# Patient Record
Sex: Male | Born: 1960 | Race: White | Hispanic: No | Marital: Single | Smoking: Current every day smoker
Health system: Southern US, Community
[De-identification: ages and names within clinical notes are randomized; demographics above are authoritative.]

## PROBLEM LIST (undated history)

## (undated) DIAGNOSIS — M1990 Unspecified osteoarthritis, unspecified site: Secondary | ICD-10-CM

## (undated) DIAGNOSIS — Z87442 Personal history of urinary calculi: Secondary | ICD-10-CM

## (undated) HISTORY — DX: Unspecified osteoarthritis, unspecified site: M19.90

## (undated) HISTORY — DX: Personal history of urinary calculi: Z87.442

## (undated) HISTORY — PX: LITHOTRIPSY: SUR834

---

## 2017-02-01 ENCOUNTER — Other Ambulatory Visit: Payer: Self-pay

## 2017-02-15 ENCOUNTER — Ambulatory Visit
Admission: RE | Admit: 2017-02-15 | Discharge: 2017-02-15 | Disposition: A | Discharge status: Home / Self Care | Payer: Self-pay | Source: Ambulatory Visit | Attending: Urology | Admitting: Urology

## 2017-02-15 ENCOUNTER — Ambulatory Visit (INDEPENDENT_AMBULATORY_CARE_PROVIDER_SITE_OTHER): Payer: Self-pay | Admitting: Urology

## 2017-02-15 ENCOUNTER — Encounter: Payer: Self-pay | Admitting: Urology

## 2017-02-15 VITALS — BP 134/80 | HR 66 | Ht 72.0 in | Wt 197.8 lb

## 2017-02-15 DIAGNOSIS — Z87442 Personal history of urinary calculi: Secondary | ICD-10-CM | POA: Insufficient documentation

## 2017-02-15 DIAGNOSIS — R109 Unspecified abdominal pain: Secondary | ICD-10-CM | POA: Insufficient documentation

## 2017-02-15 DIAGNOSIS — N200 Calculus of kidney: Secondary | ICD-10-CM | POA: Insufficient documentation

## 2017-02-15 DIAGNOSIS — R3129 Other microscopic hematuria: Secondary | ICD-10-CM | POA: Insufficient documentation

## 2017-02-15 LAB — URINALYSIS, COMPLETE
Bilirubin, UA: NEGATIVE
Glucose, UA: NEGATIVE
Ketones, UA: NEGATIVE
Leukocytes, UA: NEGATIVE
NITRITE UA: NEGATIVE
Protein, UA: NEGATIVE
RBC, UA: NEGATIVE
Specific Gravity, UA: 1.02 (ref 1.005–1.030)
UUROB: 0.2 mg/dL (ref 0.2–1.0)
pH, UA: 5.5 (ref 5.0–7.5)

## 2017-02-15 LAB — MICROSCOPIC EXAMINATION
Bacteria, UA: NONE SEEN
EPITHELIAL CELLS (NON RENAL): NONE SEEN /HPF (ref 0–10)
RBC MICROSCOPIC, UA: NONE SEEN /HPF (ref 0–?)

## 2017-02-15 IMAGING — CR DG ABDOMEN 1V
1 series · 2 of 2 positions shown · non-contrast
Comparison: None.

CLINICAL DATA: Left flank pain

EXAM:
ABDOMEN - 1 VIEW

[Series 1: dg abd 1 view · 0.14mm/px · 2 of 2 slices shown]
[im 1/2]
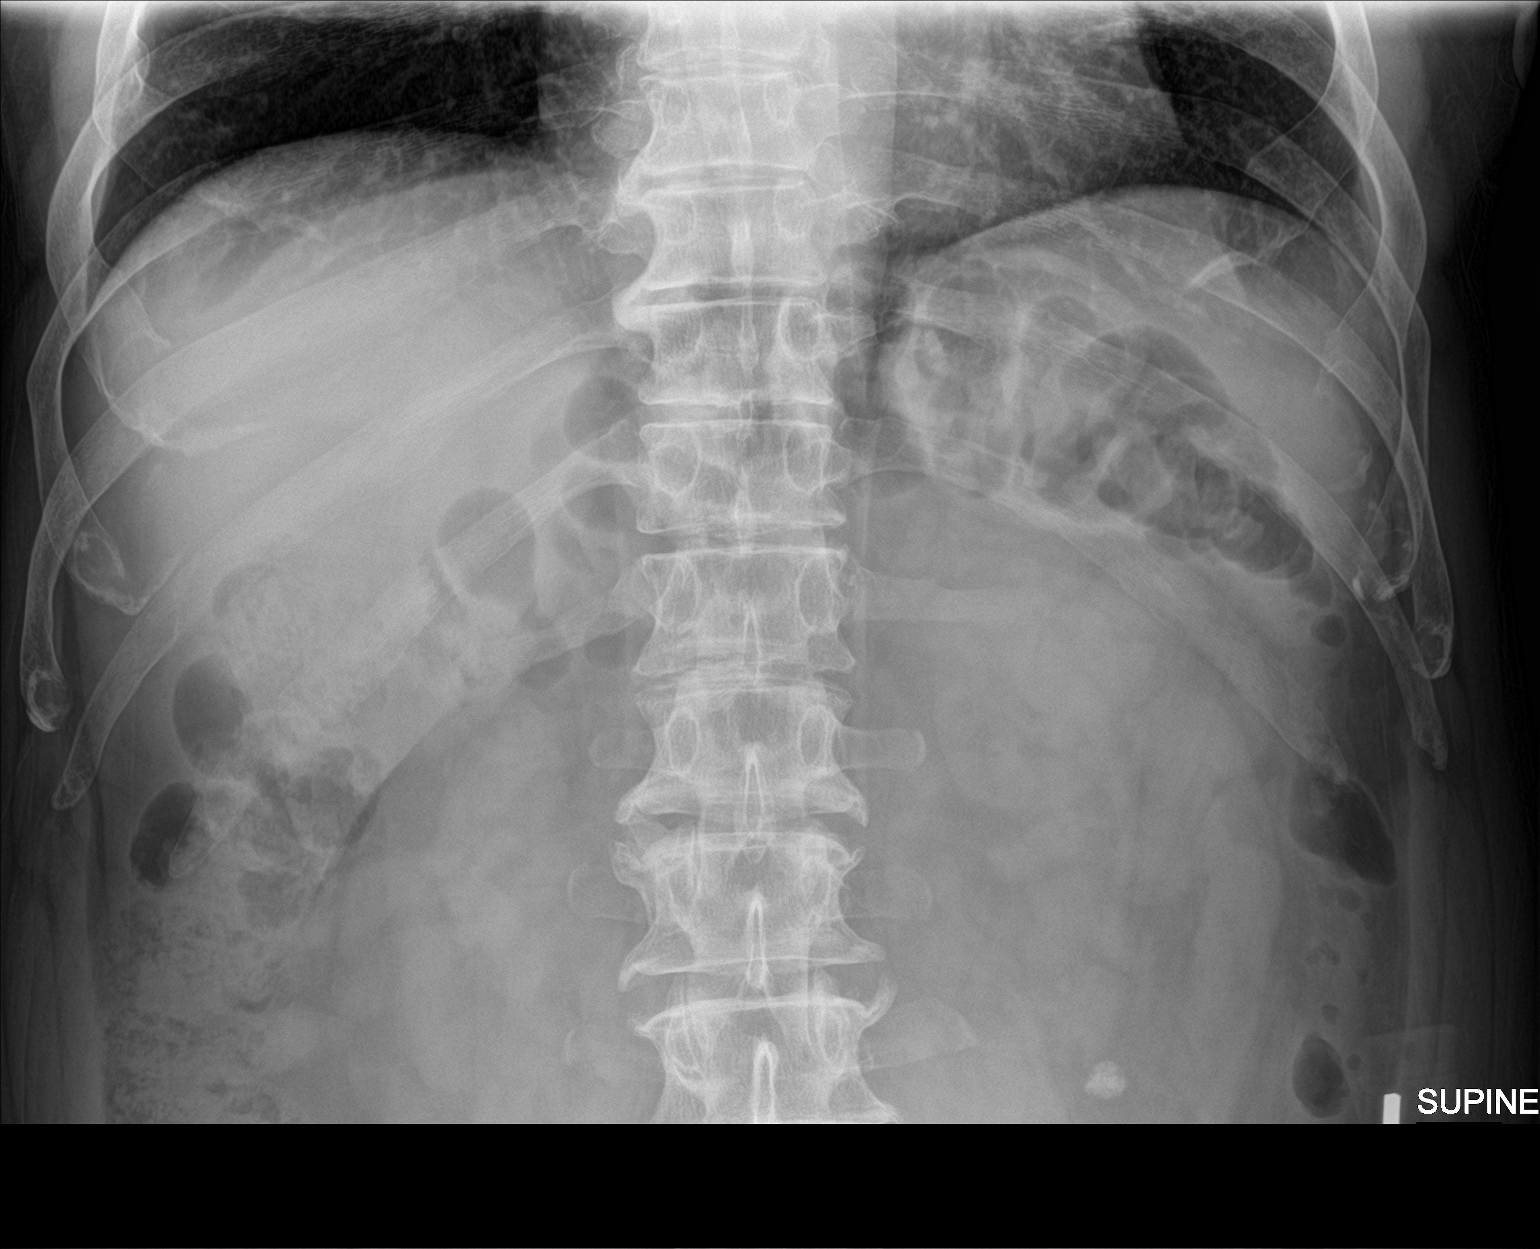
[im 2/2]
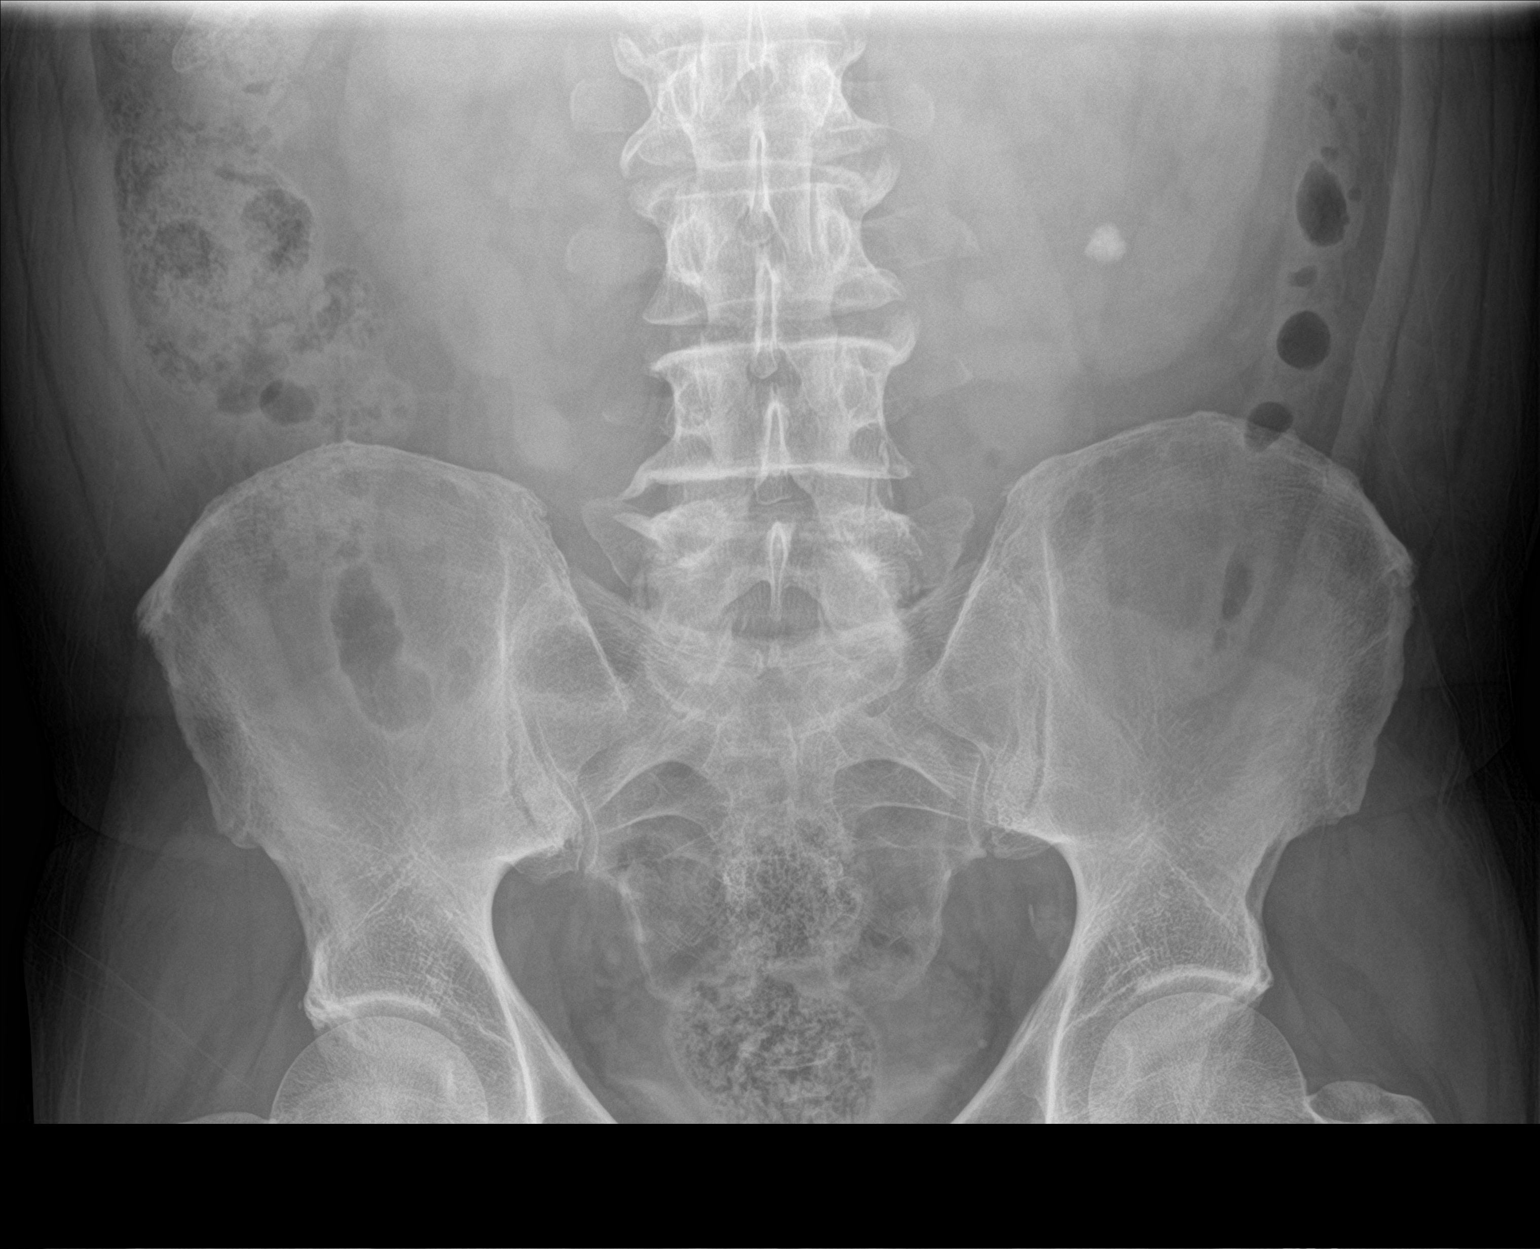

[2 of 2 positions shown; findings below may reference images not displayed]

FINDINGS: There is a calculus in the lower pole the left kidney measuring
x 1.0 cm. There are small phleboliths in the pelvis.

There is diffuse stool throughout the colon. There is no bowel
dilatation or air-fluid level to suggest bowel obstruction. No free
air. Lung bases are clear.

Note that there is paucity of gas in the mid abdomen. The colon
appears somewhat peripheral in location. These findings raise
question of a mid abdominal mass lesion.
IMPRESSION: 1.  1 x 1 cm calculus lower pole left kidney.

2. Colon is somewhat peripherally located and has an appearance that
may be draped along a central soft tissue structure. There is a
paucity of small bowel gas in the mid abdomen. These findings raise
concern for possible mid abdominal mass. Given this appearance,
consideration may wish to be given to ultrasound or CT of the
abdomen to exclude the possibility of a mid abdominal mass causing
this somewhat unusual radiographic appearance.

These results will be called to the ordering clinician or
representative by the Radiologist Assistant, and communication
documented in the PACS or zVision Dashboard.

## 2017-02-15 NOTE — Progress Notes (Signed)
02/15/2017 8:31 AM   Gregory Graves 20-Oct-1960 [id number]  Referring provider: Randa Spike, DO [phone] [facility] [address] [facility] [place], [place] [address]  Chief Complaint  Patient presents with  . Abdominal Pain    HPI: Gregory Graves is a 57 year old male seen in consultation at the request of Dr. Lorenza Chick for evaluation of flank pain and microhematuria.  In November 2018 he began to have intermittent left flank pain.  There are no identifiable precipitating, aggravating or alleviating factors.  At its worst the severity is rated 6-7/10.  He has occasional intermittent urinary stream, frequency and urinary hesitancy.  He was seen at [facility] on 01/31/2017 for this problem and urinalysis showed 4-10 RBCs.  He denies gross hematuria.  He denies fever, chills, nausea or vomiting.  Past urologic history remarkable for open stone surgery in 1993 by Dr. Leonette Monarch.   PMH: Past Medical History:  Diagnosis Date  . Arthritis   . History of kidney stones     Surgical History: Past Surgical History:  Procedure Laterality Date  . LITHOTRIPSY      Home Medications:  Allergies as of 02/15/2017   No Known Allergies     Medication List        Accurate as of 02/15/17  8:31 AM. Always use your most recent med list.          acetaminophen 325 MG tablet Commonly known as:  TYLENOL Take 650 mg by mouth every 6 (six) hours as needed.       Allergies: No Known Allergies  Family History: Family History  Problem Relation Age of Onset  . Prostate cancer Neg Hx   . Bladder Cancer Neg Hx   . Kidney cancer Neg Hx     Social History:  reports that he has been smoking.  he has never used smokeless tobacco. He reports that he does not drink alcohol or use drugs.  ROS: UROLOGY Frequent Urination?: Yes Hard to postpone urination?: No Burning/pain with urination?: No Get up at night to urinate?: No Leakage of urine?: No Urine stream starts and stops?:  Yes Trouble starting stream?: Yes Do you have to strain to urinate?: Yes Blood in urine?: Yes Urinary tract infection?: No Sexually transmitted disease?: No Injury to kidneys or bladder?: No Painful intercourse?: No Weak stream?: Yes Erection problems?: No Penile pain?: No  Gastrointestinal Nausea?: No Vomiting?: No Indigestion/heartburn?: No Diarrhea?: No Constipation?: No  Constitutional Fever: No Night sweats?: No Weight loss?: No Fatigue?: No  Skin Skin rash/lesions?: No Itching?: No  Eyes Blurred vision?: No Double vision?: No  Ears/Nose/Throat Sore throat?: No Sinus problems?: No  Hematologic/Lymphatic Swollen glands?: No Easy bruising?: No  Cardiovascular Leg swelling?: No Chest pain?: No  Respiratory Cough?: No Shortness of breath?: No  Endocrine Excessive thirst?: No  Musculoskeletal Back pain?: Yes Joint pain?: No  Neurological Headaches?: No Dizziness?: No  Psychologic Depression?: No Anxiety?: No  Physical Exam: BP 134/80 (BP Location: Right Arm, Patient Position: Sitting, Cuff Size: Normal)   Pulse 66   Ht 6' (1.829 m)   Wt 197 lb 12.8 oz (89.7 kg)   BMI 26.83 kg/m   Constitutional:  Alert and oriented, No acute distress. HEENT: [place] AT, moist mucus membranes.  Trachea midline, no masses. Cardiovascular: No clubbing, cyanosis, or edema.  CV RRR Respiratory: Normal respiratory effort, no increased work of breathing.  Lungs clear GI: Abdomen is soft, nontender, nondistended, no abdominal masses GU: No CVA tenderness.  Skin: No rashes,  bruises or suspicious lesions. Lymph: No cervical or inguinal adenopathy. Neurologic: Grossly intact, no focal deficits, moving all 4 extremities. Psychiatric: Normal mood and affect.  Laboratory Data:  Urinalysis Dipstick/microscopy negative for significant microhematuria   Assessment & Plan:  57 year old male with intermittent left flank pain, history of stones and microhematuria.   A  KUB was ordered and shows a 10 mm left lower pole calculus.  There is also a groundglass appearing mass in the abdomen suggesting a possible abdominal mass.  Recommend proceeding with a CT urogram for further evaluation.  1. Flank pain  2. Microhematuria  3. Personal history of kidney stones    Riki AltesScott C Madellyn Denio, MD  [facility] [address], [address] [place], [place] [address] [phone]

## 2017-02-23 ENCOUNTER — Ambulatory Visit: Payer: Self-pay | Admitting: Urology

## 2017-03-07 ENCOUNTER — Other Ambulatory Visit: Payer: Self-pay | Admitting: Urology

## 2017-03-07 DIAGNOSIS — R3129 Other microscopic hematuria: Secondary | ICD-10-CM

## 2017-03-21 ENCOUNTER — Telehealth: Payer: Self-pay | Admitting: Urology

## 2017-03-21 NOTE — Telephone Encounter (Signed)
He never got called for his CT scan you ordered and there was no expected date. I told scheduling to go ahead and call him to schedule since this was supposed to have been done in February. Do you want a follow  Up or just call him?  Gregory DusterMichelle

## 2017-03-21 NOTE — Telephone Encounter (Signed)
Go ahead and make a follow-up to discuss results.

## 2017-03-30 ENCOUNTER — Ambulatory Visit
Admission: RE | Admit: 2017-03-30 | Discharge: 2017-03-30 | Disposition: A | Discharge status: Home / Self Care | Payer: Self-pay | Source: Ambulatory Visit | Attending: Urology | Admitting: Urology

## 2017-03-30 DIAGNOSIS — N200 Calculus of kidney: Secondary | ICD-10-CM | POA: Insufficient documentation

## 2017-03-30 DIAGNOSIS — N1330 Unspecified hydronephrosis: Secondary | ICD-10-CM | POA: Insufficient documentation

## 2017-03-30 DIAGNOSIS — N400 Enlarged prostate without lower urinary tract symptoms: Secondary | ICD-10-CM | POA: Insufficient documentation

## 2017-03-30 DIAGNOSIS — I700 Atherosclerosis of aorta: Secondary | ICD-10-CM | POA: Insufficient documentation

## 2017-03-30 DIAGNOSIS — R3129 Other microscopic hematuria: Secondary | ICD-10-CM | POA: Insufficient documentation

## 2017-03-30 MED ORDER — IOPAMIDOL (ISOVUE-300) INJECTION 61%
125.0000 mL | Freq: Once | INTRAVENOUS | Status: AC | PRN
Start: 2017-03-30 — End: 2017-03-30
  Administered 2017-03-30: 125 mL via INTRAVENOUS

## 2017-04-05 ENCOUNTER — Encounter: Payer: Self-pay | Admitting: Urology

## 2017-04-05 ENCOUNTER — Other Ambulatory Visit: Payer: Self-pay | Admitting: Radiology

## 2017-04-05 ENCOUNTER — Ambulatory Visit (INDEPENDENT_AMBULATORY_CARE_PROVIDER_SITE_OTHER): Payer: Self-pay | Admitting: Urology

## 2017-04-05 VITALS — BP 125/73 | HR 67 | Resp 16 | Ht 71.0 in | Wt 193.3 lb

## 2017-04-05 DIAGNOSIS — N1330 Unspecified hydronephrosis: Secondary | ICD-10-CM

## 2017-04-05 DIAGNOSIS — N200 Calculus of kidney: Secondary | ICD-10-CM

## 2017-04-05 NOTE — H&P (View-Only) (Signed)
04/05/2017 10:29 AM   Gregory Graves [birth year] [id number]  Referring provider: No referring provider defined for this encounter.    Chief complaint: Follow-up  HPI: 57 year old male seen February 2019 for left flank pain and microhematuria.  He had a history of stone disease and a KUB showed a 10 mm left lower pole calculus and suggestion of a mid abdominal mass.  A CT scan was subsequently ordered and he presents today for discussion of the results.  He continues to have mild, intermittent left flank pain.  He has a history of open stone surgery in the early 1990s.  His CT shows a moderate to severe left hydronephrosis and a nonobstructing left lower pole calculus.  No ureteral dilation is noted.  PMH: Past Medical History:  Diagnosis Date  . Arthritis   . History of kidney stones     Surgical History: Past Surgical History:  Procedure Laterality Date  . LITHOTRIPSY      Home Medications:  Allergies as of 04/05/2017   No Known Allergies     Medication List        Accurate as of 04/05/17 10:29 AM. Always use your most recent med list.          acetaminophen 325 MG tablet Commonly known as:  TYLENOL Take 650 mg by mouth every 6 (six) hours as needed.       Allergies: No Known Allergies  Family History: Family History  Problem Relation Age of Onset  . Prostate cancer Neg Hx   . Bladder Cancer Neg Hx   . Kidney cancer Neg Hx     Social History:  reports that he has been smoking.  He has never used smokeless tobacco. He reports that he does not drink alcohol or use drugs.  ROS: UROLOGY Frequent Urination?: No Hard to postpone urination?: No Burning/pain with urination?: No Get up at night to urinate?: Yes Leakage of urine?: No Urine stream starts and stops?: No Trouble starting stream?: No Do you have to strain to urinate?: No Blood in urine?: No Urinary tract infection?: No Sexually transmitted disease?: No Injury to kidneys or bladder?:  No Painful intercourse?: No Weak stream?: Yes Erection problems?: No Penile pain?: No  Gastrointestinal Nausea?: No Vomiting?: No Indigestion/heartburn?: No Diarrhea?: No Constipation?: No  Constitutional Fever: No Night sweats?: No Weight loss?: No Fatigue?: No  Skin Skin rash/lesions?: No Itching?: No  Eyes Blurred vision?: No Double vision?: No  Ears/Nose/Throat Sore throat?: No Sinus problems?: No  Hematologic/Lymphatic Swollen glands?: No Easy bruising?: No  Cardiovascular Leg swelling?: No Chest pain?: No  Respiratory Cough?: No Shortness of breath?: No  Endocrine Excessive thirst?: No  Musculoskeletal Back pain?: Yes Joint pain?: No  Neurological Headaches?: No Dizziness?: No  Psychologic Depression?: No Anxiety?: No  Physical Exam: BP 125/73   Pulse 67   Resp 16   Ht 5\' 11"  (1.803 m)   Wt 193 lb 4.8 oz (87.7 kg)   SpO2 98%   BMI 26.96 kg/m   Constitutional:  Alert and oriented, No acute distress. HEENT: [place] AT, moist mucus membranes.  Trachea midline, no masses. Cardiovascular: No clubbing, cyanosis, or edema.  RRR Respiratory: Normal respiratory effort, no increased work of breathing.  Lungs clear GI: Abdomen is soft, nontender, nondistended, no abdominal masses GU: No CVA tenderness Lymph: No cervical or inguinal lymphadenopathy. Skin: No rashes, bruises or suspicious lesions. Neurologic: Grossly intact, no focal deficits, moving all 4 extremities. Psychiatric: Normal mood and affect.   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  No prior imaging is available for comparison.  We discussed the possibility of ureteral stricture.  I recommended scheduling cystoscopy with left retrograde pyelogram and left ureteroscopy.  If no significant stricture we discussed laser lithotripsy and removal of  his left renal calculus.  If significant stricture is noted will attempt stent placement.  The potential need for percutaneous nephrostomy was also discussed.  We discussed potential complications of bleeding, infection and ureteral injury as well as the risks of general anesthesia.  He indicated all questions were answered and desires to proceed.   Riki AltesScott C Letricia Krinsky, MD  [facility] [address], [address] [place], [place] [address] [phone]

## 2017-04-05 NOTE — Progress Notes (Signed)
04/05/2017 10:29 AM   Gregory Graves [birth year] [id number]  Referring provider: No referring provider defined for this encounter.    Chief complaint: Follow-up  HPI: 57 year old male seen February 2019 for left flank pain and microhematuria.  He had a history of stone disease and a KUB showed a 10 mm left lower pole calculus and suggestion of a mid abdominal mass.  A CT scan was subsequently ordered and he presents today for discussion of the results.  He continues to have mild, intermittent left flank pain.  He has a history of open stone surgery in the early 1990s.  His CT shows a moderate to severe left hydronephrosis and a nonobstructing left lower pole calculus.  No ureteral dilation is noted.  PMH: Past Medical History:  Diagnosis Date  . Arthritis   . History of kidney stones     Surgical History: Past Surgical History:  Procedure Laterality Date  . LITHOTRIPSY      Home Medications:  Allergies as of 04/05/2017   No Known Allergies     Medication List        Accurate as of 04/05/17 10:29 AM. Always use your most recent med list.          acetaminophen 325 MG tablet Commonly known as:  TYLENOL Take 650 mg by mouth every 6 (six) hours as needed.       Allergies: No Known Allergies  Family History: Family History  Problem Relation Age of Onset  . Prostate cancer Neg Hx   . Bladder Cancer Neg Hx   . Kidney cancer Neg Hx     Social History:  reports that he has been smoking.  He has never used smokeless tobacco. He reports that he does not drink alcohol or use drugs.  ROS: UROLOGY Frequent Urination?: No Hard to postpone urination?: No Burning/pain with urination?: No Get up at night to urinate?: Yes Leakage of urine?: No Urine stream starts and stops?: No Trouble starting stream?: No Do you have to strain to urinate?: No Blood in urine?: No Urinary tract infection?: No Sexually transmitted disease?: No Injury to kidneys or bladder?:  No Painful intercourse?: No Weak stream?: Yes Erection problems?: No Penile pain?: No  Gastrointestinal Nausea?: No Vomiting?: No Indigestion/heartburn?: No Diarrhea?: No Constipation?: No  Constitutional Fever: No Night sweats?: No Weight loss?: No Fatigue?: No  Skin Skin rash/lesions?: No Itching?: No  Eyes Blurred vision?: No Double vision?: No  Ears/Nose/Throat Sore throat?: No Sinus problems?: No  Hematologic/Lymphatic Swollen glands?: No Easy bruising?: No  Cardiovascular Leg swelling?: No Chest pain?: No  Respiratory Cough?: No Shortness of breath?: No  Endocrine Excessive thirst?: No  Musculoskeletal Back pain?: Yes Joint pain?: No  Neurological Headaches?: No Dizziness?: No  Psychologic Depression?: No Anxiety?: No  Physical Exam: BP 125/73   Pulse 67   Resp 16   Ht 5\' 11"  (1.803 m)   Wt 193 lb 4.8 oz (87.7 kg)   SpO2 98%   BMI 26.96 kg/m   Constitutional:  Alert and oriented, No acute distress. HEENT: [place] AT, moist mucus membranes.  Trachea midline, no masses. Cardiovascular: No clubbing, cyanosis, or edema.  RRR Respiratory: Normal respiratory effort, no increased work of breathing.  Lungs clear GI: Abdomen is soft, nontender, nondistended, no abdominal masses GU: No CVA tenderness Lymph: No cervical or inguinal lymphadenopathy. Skin: No rashes, bruises or suspicious lesions. Neurologic: Grossly intact, no focal deficits, moving all 4 extremities. Psychiatric: Normal mood and affect.   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  No prior imaging is available for comparison.  We discussed the possibility of ureteral stricture.  I recommended scheduling cystoscopy with left retrograde pyelogram and left ureteroscopy.  If no significant stricture we discussed laser lithotripsy and removal of  his left renal calculus.  If significant stricture is noted will attempt stent placement.  The potential need for percutaneous nephrostomy was also discussed.  We discussed potential complications of bleeding, infection and ureteral injury as well as the risks of general anesthesia.  He indicated all questions were answered and desires to proceed.   Riki AltesScott C Stoioff, MD  [facility] [address], [address] [place], [place] [address] [phone]

## 2017-04-06 ENCOUNTER — Other Ambulatory Visit: Payer: Self-pay | Admitting: Radiology

## 2017-04-06 ENCOUNTER — Other Ambulatory Visit: Payer: Self-pay

## 2017-04-06 DIAGNOSIS — N200 Calculus of kidney: Secondary | ICD-10-CM

## 2017-04-06 DIAGNOSIS — N1330 Unspecified hydronephrosis: Secondary | ICD-10-CM

## 2017-04-06 NOTE — Addendum Note (Signed)
Addended by: Honor LohGARRISON, Anayeli Arel M on: 04/06/2017 01:42 PM   Modules accepted: Orders

## 2017-04-07 ENCOUNTER — Other Ambulatory Visit: Payer: Self-pay | Admitting: Radiology

## 2017-04-07 LAB — URINALYSIS, COMPLETE
BILIRUBIN UA: NEGATIVE
Glucose, UA: NEGATIVE
KETONES UA: NEGATIVE
LEUKOCYTES UA: NEGATIVE
Nitrite, UA: NEGATIVE
PROTEIN UA: NEGATIVE
SPEC GRAV UA: 1.02 (ref 1.005–1.030)
UUROB: 0.2 mg/dL (ref 0.2–1.0)
pH, UA: 6.5 (ref 5.0–7.5)

## 2017-04-07 LAB — MICROSCOPIC EXAMINATION
Bacteria, UA: NONE SEEN
Epithelial Cells (non renal): NONE SEEN /hpf (ref 0–10)

## 2017-04-11 ENCOUNTER — Other Ambulatory Visit: Payer: Self-pay | Admitting: Radiology

## 2017-04-11 ENCOUNTER — Other Ambulatory Visit: Payer: Self-pay

## 2017-04-11 DIAGNOSIS — N200 Calculus of kidney: Secondary | ICD-10-CM

## 2017-04-14 ENCOUNTER — Encounter
Admission: RE | Admit: 2017-04-14 | Discharge: 2017-04-14 | Disposition: A | Discharge status: Home / Self Care | Payer: Self-pay | Source: Ambulatory Visit | Attending: Urology | Admitting: Urology

## 2017-04-14 ENCOUNTER — Other Ambulatory Visit: Payer: Self-pay

## 2017-04-14 LAB — CULTURE, URINE COMPREHENSIVE

## 2017-04-14 NOTE — Patient Instructions (Signed)
Your procedure is scheduled on: 04-18-17 Report to Same Day Surgery 2nd floor medical mall [facility](Medical Mall Entrance-take elevator on left to 2nd floor.  Check in with surgery information desk.) To find out your arrival time please call [phone] between 1PM - 3PM on 04-17-17  Remember: Instructions that are not followed completely may result in serious medical risk, up to and including death, or upon the discretion of your surgeon and anesthesiologist your surgery may need to be rescheduled.    _x___ 1. Do not eat food after midnight the night before your procedure. NO GUM OR CANDY AFTER MIDNIGHT.  You may drink clear liquids up to 2 hours before you are scheduled to arrive at the hospital for your procedure.  Do not drink clear liquids within 2 hours of your scheduled arrival to the hospital.  Clear liquids include  --Water or Apple juice without pulp  --Clear carbohydrate beverage such as ClearFast or Gatorade  --Black Coffee or Clear Tea (No milk, no creamers, do not add anything to the coffee or Tea     __x__ 2. No Alcohol for 24 hours before or after surgery.   __x__3. No Smoking or e-cigarettes for 24 prior to surgery.  Do not use any chewable tobacco products for at least 6 hour prior to surgery   ____  4. Bring all medications with you on the day of surgery if instructed.    __x__ 5. Notify your doctor if there is any change in your medical condition     (cold, fever, infections).    x___6. On the morning of surgery brush your teeth with toothpaste and water.  You may rinse your mouth with mouth wash if you wish.  Do not swallow any toothpaste or mouthwash.   Do not wear jewelry, make-up, hairpins, clips or nail polish.  Do not wear lotions, powders, or perfumes. You may wear deodorant.  Do not shave 48 hours prior to surgery. Men may shave face and neck.  Do not bring valuables to the hospital.    [facility] is not responsible for any belongings or valuables.    Contacts, dentures or bridgework may not be worn into surgery.  Leave your suitcase in the car. After surgery it may be brought to your room.  For patients admitted to the hospital, discharge time is determined by your treatment team.  _  Patients discharged the day of surgery will not be allowed to drive home.  You will need someone to drive you home and stay with you the night of your procedure.   ____ Take anti-hypertensive listed below, cardiac, seizure, asthma, anti-reflux and psychiatric medicines. These include:  1. NONE  2.  3.  4.  5.  6.  ____Fleets enema or Magnesium Citrate as directed.   ____ Use CHG Soap or sage wipes as directed on instruction sheet   ____ Use inhalers on the day of surgery and bring to hospital day of surgery  ____ Stop Metformin and Janumet 2 days prior to surgery.    ____ Take 1/2 of usual insulin dose the night before surgery and none on the morning surgery.   ____ Follow recommendations from Cardiologist, Pulmonologist or PCP regarding stopping Aspirin, Coumadin, Plavix ,Eliquis, Effient, or Pradaxa, and Pletal.  X____Stop Anti-inflammatories such as Advil, Aleve, Ibuprofen, Motrin, Naproxen, Naprosyn, Goodies powders or aspirin products NOW-OK to take Tylenol    ____ Stop supplements until after surgery.     ____ Bring C-Pap to the hospital.

## 2017-04-18 ENCOUNTER — Ambulatory Visit: Payer: Self-pay | Admitting: Anesthesiology

## 2017-04-18 ENCOUNTER — Encounter: Payer: Self-pay | Admitting: Anesthesiology

## 2017-04-18 ENCOUNTER — Ambulatory Visit
Admission: RE | Admit: 2017-04-18 | Discharge: 2017-04-18 | Disposition: A | Discharge status: Home / Self Care | Payer: Self-pay | Source: Ambulatory Visit | Attending: Urology | Admitting: Urology

## 2017-04-18 ENCOUNTER — Encounter
Admission: RE | Disposition: A | Discharge status: Home / Self Care | Payer: Self-pay | Source: Ambulatory Visit | Attending: Urology

## 2017-04-18 DIAGNOSIS — M1990 Unspecified osteoarthritis, unspecified site: Secondary | ICD-10-CM | POA: Insufficient documentation

## 2017-04-18 DIAGNOSIS — N132 Hydronephrosis with renal and ureteral calculous obstruction: Secondary | ICD-10-CM | POA: Insufficient documentation

## 2017-04-18 DIAGNOSIS — N200 Calculus of kidney: Secondary | ICD-10-CM

## 2017-04-18 DIAGNOSIS — N1330 Unspecified hydronephrosis: Secondary | ICD-10-CM

## 2017-04-18 DIAGNOSIS — Z87442 Personal history of urinary calculi: Secondary | ICD-10-CM | POA: Insufficient documentation

## 2017-04-18 HISTORY — PX: CYSTOSCOPY WITH URETEROSCOPY: SHX5123

## 2017-04-18 HISTORY — PX: CYSTOSCOPY WITH STENT PLACEMENT: SHX5790

## 2017-04-18 HISTORY — PX: CYSTOSCOPY W/ RETROGRADES: SHX1426

## 2017-04-18 SURGERY — CYSTOSCOPY, WITH RETROGRADE PYELOGRAM
Anesthesia: General | Site: Ureter | Laterality: Left | Wound class: Clean Contaminated

## 2017-04-18 MED ORDER — ONDANSETRON HCL 4 MG/2ML IJ SOLN
INTRAMUSCULAR | Status: DC | PRN
  Administered 2017-04-18: 4 mg via INTRAVENOUS

## 2017-04-18 MED ORDER — PROPOFOL 10 MG/ML IV BOLUS
INTRAVENOUS | Status: DC | PRN
  Administered 2017-04-18: 150 mg via INTRAVENOUS

## 2017-04-18 MED ORDER — DEXAMETHASONE SODIUM PHOSPHATE 10 MG/ML IJ SOLN
INTRAMUSCULAR | Status: AC
  Filled 2017-04-18: qty 1

## 2017-04-18 MED ORDER — MIDAZOLAM HCL 2 MG/2ML IJ SOLN
INTRAMUSCULAR | Status: AC
  Filled 2017-04-18: qty 2

## 2017-04-18 MED ORDER — PROPOFOL 10 MG/ML IV BOLUS
INTRAVENOUS | Status: AC
  Filled 2017-04-18: qty 40

## 2017-04-18 MED ORDER — FENTANYL CITRATE (PF) 100 MCG/2ML IJ SOLN: 25.0000 ug | INTRAMUSCULAR | Status: DC | PRN

## 2017-04-18 MED ORDER — GLYCOPYRROLATE 0.2 MG/ML IJ SOLN
INTRAMUSCULAR | Status: AC
  Filled 2017-04-18: qty 1

## 2017-04-18 MED ORDER — OXYBUTYNIN CHLORIDE 5 MG PO TABS: ORAL_TABLET | ORAL | 0 refills | Status: DC

## 2017-04-18 MED ORDER — DEXAMETHASONE SODIUM PHOSPHATE 10 MG/ML IJ SOLN
INTRAMUSCULAR | Status: DC | PRN
  Administered 2017-04-18: 10 mg via INTRAVENOUS

## 2017-04-18 MED ORDER — LIDOCAINE HCL (CARDIAC) 20 MG/ML IV SOLN
INTRAVENOUS | Status: DC | PRN
  Administered 2017-04-18: 100 mg via INTRAVENOUS

## 2017-04-18 MED ORDER — HYDROCODONE-ACETAMINOPHEN 5-325 MG PO TABS: 1.0000 | ORAL_TABLET | ORAL | 0 refills | Status: DC | PRN

## 2017-04-18 MED ORDER — FAMOTIDINE 20 MG PO TABS
ORAL_TABLET | ORAL | Status: AC
  Administered 2017-04-18: 20 mg via ORAL
  Filled 2017-04-18: qty 1

## 2017-04-18 MED ORDER — FAMOTIDINE 20 MG PO TABS
20.0000 mg | ORAL_TABLET | Freq: Once | ORAL | Status: AC
  Administered 2017-04-18: 20 mg via ORAL

## 2017-04-18 MED ORDER — MIDAZOLAM HCL 2 MG/2ML IJ SOLN
INTRAMUSCULAR | Status: DC | PRN
  Administered 2017-04-18: 2 mg via INTRAVENOUS

## 2017-04-18 MED ORDER — LACTATED RINGERS IV SOLN
INTRAVENOUS | Status: DC
  Administered 2017-04-18: 11:00:00 via INTRAVENOUS

## 2017-04-18 MED ORDER — FENTANYL CITRATE (PF) 100 MCG/2ML IJ SOLN
INTRAMUSCULAR | Status: DC | PRN
  Administered 2017-04-18 (×2): 50 ug via INTRAVENOUS

## 2017-04-18 MED ORDER — CEFAZOLIN SODIUM-DEXTROSE 2-3 GM-%(50ML) IV SOLR
INTRAVENOUS | Status: AC
  Filled 2017-04-18: qty 50

## 2017-04-18 MED ORDER — LIDOCAINE HCL (PF) 2 % IJ SOLN
INTRAMUSCULAR | Status: AC
  Filled 2017-04-18: qty 10

## 2017-04-18 MED ORDER — IOTHALAMATE MEGLUMINE 43 % IV SOLN
INTRAVENOUS | Status: DC | PRN
  Administered 2017-04-18: 15 mL via URETHRAL

## 2017-04-18 MED ORDER — ONDANSETRON HCL 4 MG/2ML IJ SOLN
INTRAMUSCULAR | Status: AC
  Filled 2017-04-18: qty 2

## 2017-04-18 MED ORDER — ONDANSETRON HCL 4 MG/2ML IJ SOLN: 4.0000 mg | Freq: Once | INTRAMUSCULAR | Status: DC | PRN

## 2017-04-18 MED ORDER — GLYCOPYRROLATE 0.2 MG/ML IJ SOLN
INTRAMUSCULAR | Status: DC | PRN
  Administered 2017-04-18: 0.2 mg via INTRAVENOUS

## 2017-04-18 MED ORDER — CEFAZOLIN SODIUM-DEXTROSE 2-4 GM/100ML-% IV SOLN
2.0000 g | INTRAVENOUS | Status: AC
  Administered 2017-04-18: 2 g via INTRAVENOUS
  Filled 2017-04-18: qty 100

## 2017-04-18 MED ORDER — FENTANYL CITRATE (PF) 100 MCG/2ML IJ SOLN
INTRAMUSCULAR | Status: AC
Start: 2017-04-18 — End: 2017-04-18
  Filled 2017-04-18: qty 2

## 2017-04-18 SURGICAL SUPPLY — 30 items
BAG DRAIN CYSTO-URO LG1000N (MISCELLANEOUS) ×4 IMPLANT
BASKET ZERO TIP 1.9FR (BASKET) IMPLANT
BRUSH SCRUB EZ  4% CHG (MISCELLANEOUS) ×2
BRUSH SCRUB EZ 1% IODOPHOR (MISCELLANEOUS) ×4 IMPLANT
BRUSH SCRUB EZ 4% CHG (MISCELLANEOUS) ×2 IMPLANT
CATH URETL 5X70 OPEN END (CATHETERS) ×4 IMPLANT
CNTNR SPEC 2.5X3XGRAD LEK (MISCELLANEOUS)
CONRAY 43 FOR UROLOGY 50M (MISCELLANEOUS) ×4 IMPLANT
CONT SPEC 4OZ STER OR WHT (MISCELLANEOUS)
CONTAINER SPEC 2.5X3XGRAD LEK (MISCELLANEOUS) IMPLANT
DRAPE UTILITY 15X26 TOWEL STRL (DRAPES) ×4 IMPLANT
FIBER LASER LITHO 273 (Laser) IMPLANT
GLOVE BIO SURGEON STRL SZ8 (GLOVE) ×4 IMPLANT
GOWN STRL REUS W/ TWL LRG LVL3 (GOWN DISPOSABLE) ×4 IMPLANT
GOWN STRL REUS W/TWL LRG LVL3 (GOWN DISPOSABLE) ×4
GUIDEWIRE GREEN .038 145CM (MISCELLANEOUS) IMPLANT
INFUSOR MANOMETER BAG 3000ML (MISCELLANEOUS) ×4 IMPLANT
INTRODUCER DILATOR DOUBLE (INTRODUCER) IMPLANT
KIT TURNOVER CYSTO (KITS) ×4 IMPLANT
PACK CYSTO AR (MISCELLANEOUS) ×4 IMPLANT
SENSORWIRE 0.038 NOT ANGLED (WIRE) ×4
SET CYSTO W/LG BORE CLAMP LF (SET/KITS/TRAYS/PACK) ×4 IMPLANT
SHEATH URETERAL 12FRX35CM (MISCELLANEOUS) IMPLANT
SOL .9 NS 3000ML IRR  AL (IV SOLUTION) ×2
SOL .9 NS 3000ML IRR UROMATIC (IV SOLUTION) ×2 IMPLANT
STENT URET 6FRX24 CONTOUR (STENTS) IMPLANT
STENT URET 6FRX26 CONTOUR (STENTS) ×4 IMPLANT
SURGILUBE 2OZ TUBE FLIPTOP (MISCELLANEOUS) ×4 IMPLANT
WATER STERILE IRR 1000ML POUR (IV SOLUTION) ×4 IMPLANT
WIRE SENSOR 0.038 NOT ANGLED (WIRE) ×2 IMPLANT

## 2017-04-18 NOTE — Transfer of Care (Signed)
Immediate Anesthesia Transfer of Care Note  Patient: Areatha KeasRandy Tarquinio  Procedure(s) Performed: Procedure(s): CYSTOSCOPY WITH RETROGRADE PYELOGRAM (Left) CYSTOSCOPY WITH URETEROSCOPY (Left) CYSTOSCOPY WITH STENT PLACEMENT (Left)  Patient Location: PACU  Anesthesia Type:General  Level of Consciousness: sedated  Airway & Oxygen Therapy: Patient Spontanous Breathing and Patient connected to face mask oxygen  Post-op Assessment: Report given to RN and Post -op Vital signs reviewed and stable  Post vital signs: Reviewed and stable  Last Vitals:  Vitals:   04/18/17 1008 04/18/17 1210  BP: 116/70 123/76  Pulse: (!) 55 81  Resp: 16 18  Temp: (!) 36.1 C 36.5 C  SpO2: 100% 100%    Complications: No apparent anesthesia complications

## 2017-04-18 NOTE — OR Nursing (Signed)
Disregard previous note stating to pacu. Note entered in error.

## 2017-04-18 NOTE — Discharge Instructions (Signed)

## 2017-04-18 NOTE — Anesthesia Procedure Notes (Signed)
Procedure Name: LMA Insertion Date/Time: 04/18/2017 11:32 AM Performed by: Stormy Fabianurtis, Dorthy Magnussen, CRNA Pre-anesthesia Checklist: Patient identified, Patient being monitored, Timeout performed, Emergency Drugs available and Suction available Patient Re-evaluated:Patient Re-evaluated prior to induction Oxygen Delivery Method: Circle system utilized Preoxygenation: Pre-oxygenation with 100% oxygen Induction Type: IV induction Ventilation: Mask ventilation without difficulty LMA: LMA inserted LMA Size: 3.5 Tube type: Oral Number of attempts: 1 Placement Confirmation: positive ETCO2 and breath sounds checked- equal and bilateral Tube secured with: Tape Dental Injury: Teeth and Oropharynx as per pre-operative assessment

## 2017-04-18 NOTE — Anesthesia Postprocedure Evaluation (Signed)
Anesthesia Post Note  Patient: Gregory Graves  Procedure(s) Performed: CYSTOSCOPY WITH RETROGRADE PYELOGRAM (Left Ureter) CYSTOSCOPY WITH URETEROSCOPY (Left Ureter) CYSTOSCOPY WITH STENT PLACEMENT (Left Ureter)  Patient location during evaluation: PACU Anesthesia Type: General Level of consciousness: awake and alert Pain management: pain level controlled Vital Signs Assessment: post-procedure vital signs reviewed and stable Respiratory status: spontaneous breathing, nonlabored ventilation, respiratory function stable and patient connected to nasal cannula oxygen Cardiovascular status: blood pressure returned to baseline and stable Postop Assessment: no apparent nausea or vomiting Anesthetic complications: no     Last Vitals:  Vitals:   04/18/17 1240 04/18/17 1249  BP: 123/75 123/69  Pulse: 63 (!) 48  Resp: 14 14  Temp: (!) 36.3 C 36.5 C  SpO2: 99% 100%    Last Pain:  Vitals:   04/18/17 1249  TempSrc: Oral  PainSc: 0-No pain                 Nellie Pester S

## 2017-04-18 NOTE — Anesthesia Post-op Follow-up Note (Signed)
Anesthesia QCDR form completed.        

## 2017-04-18 NOTE — Interval H&P Note (Signed)
History and Physical Interval Note:  04/18/2017 10:59 AM  Gregory Graves  has presented today for surgery, with the diagnosis of left hydronephrosis, left nephrolithiasis  The various methods of treatment have been discussed with the patient and family. After consideration of risks, benefits and other options for treatment, the patient has consented to  Procedure(s): CYSTOSCOPY/URETEROSCOPY/HOLMIUM LASER/STENT PLACEMENT (Left) CYSTOSCOPY WITH RETROGRADE PYELOGRAM (Left) as a surgical intervention .  The patient's history has been reviewed, patient examined, no change in status, stable for surgery.  I have reviewed the patient's chart and labs.  Questions were answered to the patient's satisfaction.     Scott C Stoioff

## 2017-04-18 NOTE — Op Note (Signed)
Preoperative diagnosis:  1. Left hydronephrosis 2. Left nephrolithiasis  Postoperative diagnosis:  1. Left hydronephrosis 2. Left nephrolithiasis  Procedure:  1. Cystoscopy 2. Left ureteroscopy 3. Left ureteral stent placement (6FR) 26 cm 4. Left retrograde pyelography with interpretation   Surgeon: Lorin PicketScott C. Adelina Collard, M.D.  Anesthesia: General  Complications: None  Intraoperative findings:  1. Retrograde pyelogram- moderate to severe left hydronephrosis.;  Nonobstructing left calyceal calculus.  Narrowing of the left proximal ureter for approximately 2 cm  EBL: Minimal  Specimens: None  Indication: Gregory KeasRandy Graves is a 57 y.o. patient with intermittent left flank pain.  He has a history of stone disease and open surgery for a left ureteral calculus in the early 1990s.  A CT urogram showed severe left hydronephrosis at the UPJ with apparent obstruction.  He presents for cystoscopy with left retrograde pyelogram, possible left ureteral stent placement and ureteroscopy.. After reviewing the management options for treatment, he elected to proceed with the above surgical procedure(s). We have discussed the potential benefits and risks of the procedure, side effects of the proposed treatment, the likelihood of the patient achieving the goals of the procedure, and any potential problems that might occur during the procedure or recuperation. Informed consent has been obtained.  Description of procedure:  The patient was taken to the operating room and general anesthesia was induced.  The patient was placed in the dorsal lithotomy position, prepped and draped in the usual sterile fashion, and preoperative antibiotics were administered. A preoperative time-out was performed.   Cystourethroscopy was performed.  The patient's urethra was examined and was normal.  The prostatic urethra showed mild lateral lobe enlargement with moderate bladder neck elevation. The bladder was then systematically  examined in its entirety. There was no evidence for any bladder tumors, stones, or other mucosal pathology.    Attention then turned to the left ureteral orifice and a ureteral catheter was used to intubate the ureteral orifice over a 0.035 sensor wire.  Omnipaque contrast was injected through the ureteral catheter and a retrograde pyelogram was performed with findings as dictated above.  A 0.38 sensor guidewire was then advanced up the left ureter into the renal pelvis under fluoroscopic guidance.    The cystoscope was removed.  A 6 French semirigid scope was passed under direct vision.  The left ureteral orifice was engaged without dilation and advanced proximally.  The ureteroscope was advanced to the lower proximal ureter however could not be advanced further.  Repeat retrograde pyelogram again shows narrowing of the ureter and it was elected to place a ureteral stent for a period of stent dilation.  The cystoscope was backloaded over the wire and a 6 French/26 cm double-J ureteral stent was placed without difficulty.  There was a good curl noted in the superior portion of a bifid collecting system and was adequately positioned in the bladder under direct vision.  The bladder was emptied and cystoscope was removed.  After anesthetic reversal he was transported to the PACU in stable condition.

## 2017-04-18 NOTE — Anesthesia Preprocedure Evaluation (Addendum)
Anesthesia Evaluation  Patient identified by MRN, date of birth, ID band Patient awake    Reviewed: Allergy & Precautions, NPO status , Patient's Chart, lab work & pertinent test results, reviewed documented beta blocker date and time   Airway Mallampati: II  TM Distance: >3 FB     Dental  (+) Chipped, Missing   Pulmonary Current Smoker,           Cardiovascular      Neuro/Psych    GI/Hepatic   Endo/Other    Renal/GU      Musculoskeletal  (+) Arthritis ,   Abdominal   Peds  Hematology   Anesthesia Other Findings   Reproductive/Obstetrics                            Anesthesia Physical Anesthesia Plan  ASA: II  Anesthesia Plan: General   Post-op Pain Management:    Induction: Intravenous  PONV Risk Score and Plan:   Airway Management Planned: LMA  Additional Equipment:   Intra-op Plan:   Post-operative Plan:   Informed Consent: I have reviewed the patients History and Physical, chart, labs and discussed the procedure including the risks, benefits and alternatives for the proposed anesthesia with the patient or authorized representative who has indicated his/her understanding and acceptance.     Plan Discussed with: CRNA  Anesthesia Plan Comments:         Anesthesia Quick Evaluation

## 2017-04-18 NOTE — OR Nursing (Signed)
To pacu for block 

## 2017-04-27 ENCOUNTER — Ambulatory Visit: Payer: Self-pay | Admitting: Urology

## 2017-05-02 ENCOUNTER — Ambulatory Visit: Payer: Self-pay | Admitting: Urology

## 2017-05-03 ENCOUNTER — Ambulatory Visit (INDEPENDENT_AMBULATORY_CARE_PROVIDER_SITE_OTHER): Payer: Self-pay | Admitting: Urology

## 2017-05-03 ENCOUNTER — Encounter: Payer: Self-pay | Admitting: Urology

## 2017-05-03 DIAGNOSIS — Q6211 Congenital occlusion of ureteropelvic junction: Secondary | ICD-10-CM

## 2017-05-03 NOTE — Progress Notes (Signed)
05/03/2017 2:59 PM   Gregory Graves [birth year] [id number]  Referring provider: No referring provider defined for this encounter.  Chief Complaint  Patient presents with  . Follow-up    HPI: 57 year old male presents for postop follow-up.  He was found to have severe left hydronephrosis and a nonobstructing left renal calculus.  He underwent cystoscopy with left foot good pyelogram on 04/18/2017 with findings of a 2 cm narrowed segment of the left proximal ureter.  A ureteral stent was placed and he presents for follow-up.  He states his pain has resolved.  He has no significant stent irritative symptoms.   PMH: Past Medical History:  Diagnosis Date  . Arthritis   . History of kidney stones     Surgical History: Past Surgical History:  Procedure Laterality Date  . CYSTOSCOPY W/ RETROGRADES Left 04/18/2017   Procedure: CYSTOSCOPY WITH RETROGRADE PYELOGRAM;  Surgeon: Riki Altes, MD;  Location: [facility];  Service: Urology;  Laterality: Left;  . CYSTOSCOPY WITH STENT PLACEMENT Left 04/18/2017   Procedure: CYSTOSCOPY WITH STENT PLACEMENT;  Surgeon: Riki Altes, MD;  Location: [facility];  Service: Urology;  Laterality: Left;  . CYSTOSCOPY WITH URETEROSCOPY Left 04/18/2017   Procedure: CYSTOSCOPY WITH URETEROSCOPY;  Surgeon: Riki Altes, MD;  Location: [facility];  Service: Urology;  Laterality: Left;  . LITHOTRIPSY      Home Medications:  Allergies as of 05/03/2017   No Known Allergies     Medication List        Accurate as of 05/03/17  2:59 PM. Always use your most recent med list.          acetaminophen 500 MG tablet Commonly known as:  TYLENOL Take 1,000 mg by mouth every 6 (six) hours as needed for moderate pain or headache.   CLEAR EYES OP Place 2 drops into both eyes daily as needed (for dry eyes).   HYDROcodone-acetaminophen 5-325 MG tablet Commonly known as:  NORCO/VICODIN Take 1 tablet by mouth every 4 (four) hours as needed for moderate pain.     multivitamin tablet Take 1 tablet by mouth daily.   oxybutynin 5 MG tablet Commonly known as:  DITROPAN 1 tab tid prn frequency,urgency, bladder spasm       Allergies: No Known Allergies  Family History: Family History  Problem Relation Age of Onset  . Prostate cancer Neg Hx   . Bladder Cancer Neg Hx   . Kidney cancer Neg Hx     Social History:  reports that he has been smoking cigarettes.  He has a 9.00 pack-year smoking history. He has never used smokeless tobacco. He reports that he does not drink alcohol or use drugs.  ROS: UROLOGY Frequent Urination?: No Hard to postpone urination?: No Burning/pain with urination?: No Get up at night to urinate?: No Leakage of urine?: No Urine stream starts and stops?: No Trouble starting stream?: No Do you have to strain to urinate?: No Blood in urine?: No Urinary tract infection?: No Sexually transmitted disease?: No Injury to kidneys or bladder?: No Painful intercourse?: No Weak stream?: No Erection problems?: No Penile pain?: No  Gastrointestinal Nausea?: No Vomiting?: No Indigestion/heartburn?: No Diarrhea?: No Constipation?: No  Constitutional Fever: No Night sweats?: No Weight loss?: No Fatigue?: No  Skin Skin rash/lesions?: No Itching?: No  Eyes Blurred vision?: No Double vision?: No  Ears/Nose/Throat Sore throat?: No Sinus problems?: No  Hematologic/Lymphatic Swollen glands?: No Easy bruising?: No  Cardiovascular Leg swelling?: No Chest pain?: No  Respiratory Cough?:  No Shortness of breath?: No  Endocrine Excessive thirst?: No  Musculoskeletal Back pain?: No Joint pain?: No  Neurological Headaches?: No Dizziness?: No  Psychologic Depression?: No Anxiety?: No  Physical Exam: BP 127/73   Pulse 68   Resp 16   Ht 6' (1.829 m)   Wt 187 lb 6.4 oz (85 kg)   SpO2 98%   BMI 25.42 kg/m   Constitutional:  Alert and oriented, No acute distress.    Assessment & Plan:   Left  hydronephrosis secondary to obstruction of the left proximal ureter.  He has a previous history of open stone surgery.  Recommend scheduling renal scan to assess function.  He will be notified with the results and follow-up recommendations.    Riki Altes, MD  [facility] [address], [address] [place], [place] [address] [phone]

## 2017-05-10 ENCOUNTER — Ambulatory Visit
Admission: RE | Admit: 2017-05-10 | Discharge: 2017-05-10 | Disposition: A | Discharge status: Home / Self Care | Payer: Self-pay | Source: Ambulatory Visit | Attending: Urology | Admitting: Urology

## 2017-05-10 DIAGNOSIS — Q6211 Congenital occlusion of ureteropelvic junction: Secondary | ICD-10-CM | POA: Insufficient documentation

## 2017-05-10 DIAGNOSIS — N1330 Unspecified hydronephrosis: Secondary | ICD-10-CM | POA: Insufficient documentation

## 2017-05-10 IMAGING — NM NM RENAL IMAGING FLOW W/ PHARM
3 series · 18 of 18 positions shown · non-contrast
Comparison: CT abdomen and pelvis 03/30/2017

CLINICAL DATA: LEFT hydronephrosis, post stenting 3 weeks ago,
history of kidney stones with surgery

EXAM:
NUCLEAR MEDICINE RENAL SCAN WITH DIURETIC ADMINISTRATION
TECHNIQUE: Radionuclide angiographic and sequential renal images were obtained
after intravenous injection of radiopharmaceutical. Imaging was
continued during slow intravenous injection of Lasix approximately
15 minutes after the start of the examination.
RADIOPHARMACEUTICALS:  5.23 mCi Jechnetium-33m MAG3 IV
Pharmaceutical: 42.5 mg Lasix IV 20 minutes into imaging

[Series 1000: lasix renal · 7.79mm/px · 6 of 114 frames shown]
[frame 10/114]
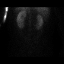
[frame 29/114]
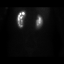
[frame 48/114]
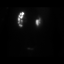
[frame 67/114]
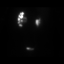
[frame 86/114]
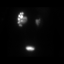
[frame 105/114]
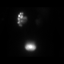

[Series 1000: lasix renal (first dynamic renal results) · 7.79mm/px · 6 of 114 frames shown]
[frame 10/114]
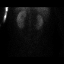
[frame 29/114]
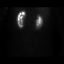
[frame 48/114]
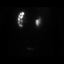
[frame 67/114]
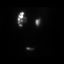
[frame 86/114]
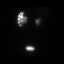
[frame 105/114]
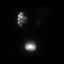

[Series 1000: lasix renal (results) · 7.79mm/px · 6 of 114 frames shown]
[frame 10/114]
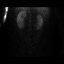
[frame 29/114]
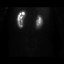
[frame 48/114]
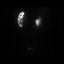
[frame 67/114]
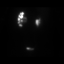
[frame 86/114]
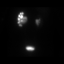
[frame 105/114]
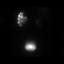

[18 of 18 positions shown; findings below may reference images not displayed]

FINDINGS: Flow:  Prompt symmetric arterial flow to both kidneys.

Left renogram: Mildly delayed uptake and concentration of tracer.
Excretion of tracer into 8 markedly dilated LEFT renal collecting
system. No significant washout of tracer occurs prior to Lasix
administration. Following Lasix, washout of tracer from the LEFT
kidney is identified. Delayed time to peak activity of 23.8 minutes
with fall to half maximum activity at 41.9 minutes.

Right renogram: Minimally delayed uptake, concentration and
excretion of tracer by RIGHT kidney. Smaller RIGHT kidney than LEFT.
Delayed time to peak activity with little washout prior to Lasix.
Following diuretic administration, washout of tracer from a a mildly
dilated RIGHT renal pelvis is identified. Delayed time to peak
activity of approximately 17 x 8 8 minutes with fall to half maximum
activity at 30.5 minutes.

Differential:

Left kidney = 62 %

Right kidney = 38 %

T1/2 post Lasix :

Left kidney = 20.9 min

Right kidney = 9.5 min
IMPRESSION: Markedly dilated LEFT renal collecting system though adequate
washout of tracer from the LEFT kidney is seen following diuretic
administration.

Mildly dilated RIGHT renal pelvis.

No evidence of urinary outflow obstruction from the kidneys.

## 2017-05-10 MED ORDER — TECHNETIUM TC 99M MERTIATIDE
5.2300 | Freq: Once | INTRAVENOUS | Status: AC | PRN
Start: 2017-05-10 — End: 2017-05-10
  Administered 2017-05-10: 5.23 via INTRAVENOUS

## 2017-05-10 MED ORDER — FUROSEMIDE 10 MG/ML IJ SOLN
42.5000 mg | Freq: Once | INTRAMUSCULAR | Status: AC
  Administered 2017-05-10: 43 mg via INTRAVENOUS
  Filled 2017-05-10: qty 4.3

## 2017-05-15 ENCOUNTER — Telehealth: Payer: Self-pay

## 2017-05-15 NOTE — Telephone Encounter (Signed)
Patient notified and apt scheduled 

## 2017-05-15 NOTE — Telephone Encounter (Signed)
-----   Message from Riki Altes, MD sent at 05/15/2017  8:32 AM EDT ----- Renal scan shows better function of his left kidney as compared to the right.  Recommend follow-up visit to discuss treatment options.

## 2017-05-16 ENCOUNTER — Encounter: Payer: Self-pay | Admitting: Urology

## 2017-05-16 ENCOUNTER — Other Ambulatory Visit: Payer: Self-pay | Admitting: Radiology

## 2017-05-16 ENCOUNTER — Other Ambulatory Visit: Payer: Self-pay

## 2017-05-16 ENCOUNTER — Ambulatory Visit (INDEPENDENT_AMBULATORY_CARE_PROVIDER_SITE_OTHER): Payer: Self-pay | Admitting: Urology

## 2017-05-16 VITALS — BP 121/56 | HR 66 | Ht 71.0 in | Wt 187.9 lb

## 2017-05-16 DIAGNOSIS — N200 Calculus of kidney: Secondary | ICD-10-CM

## 2017-05-16 DIAGNOSIS — Q6211 Congenital occlusion of ureteropelvic junction: Secondary | ICD-10-CM

## 2017-05-16 LAB — MICROSCOPIC EXAMINATION: RBC, UA: >30 /hpf — ABNORMAL HIGH (ref 0–2)

## 2017-05-16 LAB — URINALYSIS, COMPLETE
BILIRUBIN UA: NEGATIVE
GLUCOSE, UA: NEGATIVE
KETONES UA: NEGATIVE
LEUKOCYTES UA: NEGATIVE
Nitrite, UA: NEGATIVE
PH UA: 5.5 (ref 5.0–7.5)
Specific Gravity, UA: 1.025 (ref 1.005–1.030)
Urobilinogen, Ur: 0.2 mg/dL (ref 0.2–1.0)

## 2017-05-16 NOTE — Progress Notes (Signed)
05/16/2017 1:36 PM   Areatha Keas [birth year] [id number]  Referring provider: No referring provider defined for this encounter.  Chief Complaint  Patient presents with  . Follow-up    HPI: 57 year old male with left hydronephrosis and left nephrolithiasis.  He underwent cystoscopy with left retrograde pyelogram and ureteroscopy on 04/18/2017 with findings of slight narrowing of the left proximal ureter which did not appear to be dense.  A ureteral stent was placed.  Recent renal scan showed actual better function of the left kidney at 62% and 38% function of the right kidney.  He has no complaints today and presents to discuss management options.   PMH: Past Medical History:  Diagnosis Date  . Arthritis   . History of kidney stones     Surgical History: Past Surgical History:  Procedure Laterality Date  . CYSTOSCOPY W/ RETROGRADES Left 04/18/2017   Procedure: CYSTOSCOPY WITH RETROGRADE PYELOGRAM;  Surgeon: Riki Altes, MD;  Location: [facility];  Service: Urology;  Laterality: Left;  . CYSTOSCOPY WITH STENT PLACEMENT Left 04/18/2017   Procedure: CYSTOSCOPY WITH STENT PLACEMENT;  Surgeon: Riki Altes, MD;  Location: [facility];  Service: Urology;  Laterality: Left;  . CYSTOSCOPY WITH URETEROSCOPY Left 04/18/2017   Procedure: CYSTOSCOPY WITH URETEROSCOPY;  Surgeon: Riki Altes, MD;  Location: [facility];  Service: Urology;  Laterality: Left;  . LITHOTRIPSY      Home Medications:  Allergies as of 05/16/2017   No Known Allergies     Medication List        Accurate as of 05/16/17  1:36 PM. Always use your most recent med list.          acetaminophen 500 MG tablet Commonly known as:  TYLENOL Take 1,000 mg by mouth every 6 (six) hours as needed for moderate pain or headache.   CLEAR EYES OP Place 2 drops into both eyes daily as needed (for dry eyes).   HYDROcodone-acetaminophen 5-325 MG tablet Commonly known as:  NORCO/VICODIN Take 1 tablet by mouth every 4  (four) hours as needed for moderate pain.   multivitamin tablet Take 1 tablet by mouth daily.   oxybutynin 5 MG tablet Commonly known as:  DITROPAN 1 tab tid prn frequency,urgency, bladder spasm       Allergies: No Known Allergies  Family History: Family History  Problem Relation Age of Onset  . Prostate cancer Neg Hx   . Bladder Cancer Neg Hx   . Kidney cancer Neg Hx     Social History:  reports that he has been smoking cigarettes.  He has a 9.00 pack-year smoking history. He has never used smokeless tobacco. He reports that he does not drink alcohol or use drugs.  ROS: UROLOGY Frequent Urination?: No Hard to postpone urination?: No Burning/pain with urination?: No Get up at night to urinate?: No Leakage of urine?: No Urine stream starts and stops?: No Trouble starting stream?: No Do you have to strain to urinate?: No Blood in urine?: No Urinary tract infection?: No Sexually transmitted disease?: No Injury to kidneys or bladder?: No Painful intercourse?: No Weak stream?: No Erection problems?: No Penile pain?: No  Gastrointestinal Nausea?: No Vomiting?: No Indigestion/heartburn?: No Diarrhea?: No Constipation?: No  Constitutional Fever: No Night sweats?: No Weight loss?: No Fatigue?: No  Skin Skin rash/lesions?: No Itching?: No  Eyes Blurred vision?: No Double vision?: No  Ears/Nose/Throat Sore throat?: No Sinus problems?: No  Hematologic/Lymphatic Swollen glands?: No Easy bruising?: No  Cardiovascular Leg swelling?: No Chest pain?:  No  Respiratory Cough?: No Shortness of breath?: No  Endocrine Excessive thirst?: No  Musculoskeletal Back pain?: No Joint pain?: No  Neurological Headaches?: No Dizziness?: No  Psychologic Depression?: No Anxiety?: No  Physical Exam: BP (!) 121/56 (BP Location: Right Arm, Patient Position: Sitting, Cuff Size: Normal)   Pulse 66   Ht 5' 11" (1.803 m)   Wt 187 lb 14.4 oz (85.2 kg)   SpO2  99%   BMI 26.21 kg/m   Constitutional:  Alert and oriented, No acute distress. HEENT: [place] AT, moist mucus membranes.  Trachea midline, no masses. Cardiovascular: No clubbing, cyanosis, or edema. RRR Respiratory: Normal respiratory effort, no increased work of breathing.  Clear GI: Abdomen is soft, nontender, nondistended, no abdominal masses GU: No CVA tenderness Lymph: No cervical or inguinal lymphadenopathy. Skin: No rashes, bruises or suspicious lesions. Neurologic: Grossly intact, no focal deficits, moving all 4 extremities. Psychiatric: Normal mood and affect.    Assessment & Plan:   Left hydronephrosis secondary to narrowing of the left proximal ureter.  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  Since he did not appear to have dense stricture and stent dilation may have improved this area I discussed follow-up ureteroscopy and laser lithotripsy with possibility that access to the kidney may not be obtained.  We also discussed consideration of a more definitive procedure including laparoscopic treatment.  He would like a follow-up ureteroscopy initially and if unsuccessful go to a more invasive procedure.  The procedure was discussed in detail including potential risks of bleeding, infection, ureteral injury, recurrent ureteral stricture as well as anesthetic risks.  He indicated all questions were answered and desires to proceed.    Scott C Stoioff, MD  [facility] [address], [address] [place], [place] [address] [phone]  

## 2017-05-16 NOTE — H&P (View-Only) (Signed)
05/16/2017 1:36 PM   Gregory Graves [birth year] [id number]  Referring provider: No referring provider defined for this encounter.  Chief Complaint  Patient presents with  . Follow-up    HPI: 57 year old male with left hydronephrosis and left nephrolithiasis.  He underwent cystoscopy with left retrograde pyelogram and ureteroscopy on 04/18/2017 with findings of slight narrowing of the left proximal ureter which did not appear to be dense.  A ureteral stent was placed.  Recent renal scan showed actual better function of the left kidney at 62% and 38% function of the right kidney.  He has no complaints today and presents to discuss management options.   PMH: Past Medical History:  Diagnosis Date  . Arthritis   . History of kidney stones     Surgical History: Past Surgical History:  Procedure Laterality Date  . CYSTOSCOPY W/ RETROGRADES Left 04/18/2017   Procedure: CYSTOSCOPY WITH RETROGRADE PYELOGRAM;  Surgeon: Riki Altes, MD;  Location: [facility];  Service: Urology;  Laterality: Left;  . CYSTOSCOPY WITH STENT PLACEMENT Left 04/18/2017   Procedure: CYSTOSCOPY WITH STENT PLACEMENT;  Surgeon: Riki Altes, MD;  Location: [facility];  Service: Urology;  Laterality: Left;  . CYSTOSCOPY WITH URETEROSCOPY Left 04/18/2017   Procedure: CYSTOSCOPY WITH URETEROSCOPY;  Surgeon: Riki Altes, MD;  Location: [facility];  Service: Urology;  Laterality: Left;  . LITHOTRIPSY      Home Medications:  Allergies as of 05/16/2017   No Known Allergies     Medication List        Accurate as of 05/16/17  1:36 PM. Always use your most recent med list.          acetaminophen 500 MG tablet Commonly known as:  TYLENOL Take 1,000 mg by mouth every 6 (six) hours as needed for moderate pain or headache.   CLEAR EYES OP Place 2 drops into both eyes daily as needed (for dry eyes).   HYDROcodone-acetaminophen 5-325 MG tablet Commonly known as:  NORCO/VICODIN Take 1 tablet by mouth every 4  (four) hours as needed for moderate pain.   multivitamin tablet Take 1 tablet by mouth daily.   oxybutynin 5 MG tablet Commonly known as:  DITROPAN 1 tab tid prn frequency,urgency, bladder spasm       Allergies: No Known Allergies  Family History: Family History  Problem Relation Age of Onset  . Prostate cancer Neg Hx   . Bladder Cancer Neg Hx   . Kidney cancer Neg Hx     Social History:  reports that he has been smoking cigarettes.  He has a 9.00 pack-year smoking history. He has never used smokeless tobacco. He reports that he does not drink alcohol or use drugs.  ROS: UROLOGY Frequent Urination?: No Hard to postpone urination?: No Burning/pain with urination?: No Get up at night to urinate?: No Leakage of urine?: No Urine stream starts and stops?: No Trouble starting stream?: No Do you have to strain to urinate?: No Blood in urine?: No Urinary tract infection?: No Sexually transmitted disease?: No Injury to kidneys or bladder?: No Painful intercourse?: No Weak stream?: No Erection problems?: No Penile pain?: No  Gastrointestinal Nausea?: No Vomiting?: No Indigestion/heartburn?: No Diarrhea?: No Constipation?: No  Constitutional Fever: No Night sweats?: No Weight loss?: No Fatigue?: No  Skin Skin rash/lesions?: No Itching?: No  Eyes Blurred vision?: No Double vision?: No  Ears/Nose/Throat Sore throat?: No Sinus problems?: No  Hematologic/Lymphatic Swollen glands?: No Easy bruising?: No  Cardiovascular Leg swelling?: No Chest pain?:  No  Respiratory Cough?: No Shortness of breath?: No  Endocrine Excessive thirst?: No  Musculoskeletal Back pain?: No Joint pain?: No  Neurological Headaches?: No Dizziness?: No  Psychologic Depression?: No Anxiety?: No  Physical Exam: BP (!) 121/56 (BP Location: Right Arm, Patient Position: Sitting, Cuff Size: Normal)   Pulse 66   Ht  (1.803 m)   Wt 187 lb 14.4 oz (85.2 kg)   SpO2  99%   BMI 26.21 kg/m   Constitutional:  Alert and oriented, No acute distress. HEENT: [place] AT, moist mucus membranes.  Trachea midline, no masses. Cardiovascular: No clubbing, cyanosis, or edema. RRR Respiratory: Normal respiratory effort, no increased work of breathing.  Clear GI: Abdomen is soft, nontender, nondistended, no abdominal masses GU: No CVA tenderness Lymph: No cervical or inguinal lymphadenopathy. Skin: No rashes, bruises or suspicious lesions. Neurologic: Grossly intact, no focal deficits, moving all 4 extremities. Psychiatric: Normal mood and affect.    Assessment & Plan:   Left hydronephrosis secondary to narrowing of the left proximal ureter.  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  Since he did not appear to have dense stricture and stent dilation may have improved this area I discussed follow-up ureteroscopy and laser lithotripsy with possibility that access to the kidney may not be obtained.  We also discussed consideration of a more definitive procedure including laparoscopic treatment.  He would like a follow-up ureteroscopy initially and if unsuccessful go to a more invasive procedure.  The procedure was discussed in detail including potential risks of bleeding, infection, ureteral injury, recurrent ureteral stricture as well as anesthetic risks.  He indicated all questions were answered and desires to proceed.    Riki Altes, MD  [facility] [address], [address] [place], [place] [address] [phone]

## 2017-05-17 ENCOUNTER — Other Ambulatory Visit: Payer: Self-pay | Admitting: Radiology

## 2017-05-17 DIAGNOSIS — N200 Calculus of kidney: Secondary | ICD-10-CM

## 2017-05-17 DIAGNOSIS — Q6211 Congenital occlusion of ureteropelvic junction: Secondary | ICD-10-CM

## 2017-05-18 LAB — CULTURE, URINE COMPREHENSIVE

## 2017-05-26 ENCOUNTER — Inpatient Hospital Stay: Admission: RE | Admit: 2017-05-26 | Payer: Self-pay | Source: Ambulatory Visit

## 2017-05-30 ENCOUNTER — Encounter
Admission: RE | Admit: 2017-05-30 | Discharge: 2017-05-30 | Disposition: A | Discharge status: Home / Self Care | Payer: Self-pay | Source: Ambulatory Visit | Attending: Urology | Admitting: Urology

## 2017-05-30 ENCOUNTER — Other Ambulatory Visit: Payer: Self-pay

## 2017-05-30 NOTE — Patient Instructions (Signed)
Your procedure is scheduled on: 06-02-17  Report to Same Day Surgery 2nd floor medical mall [facility] Entrance-take elevator on left to 2nd floor.  Check in with surgery information desk.) To find out your arrival time please call [phone] between 1PM - 3PM on 06-01-17  Remember: Instructions that are not followed completely may result in serious medical risk, up to and including death, or upon the discretion of your surgeon and anesthesiologist your surgery may need to be rescheduled.    _x___ 1. Do not eat food after midnight the night before your procedure. NO GUM OR CANDY AFTER MIDNIGHT.  You may drink clear liquids up to 2 hours before you are scheduled to arrive at the hospital for your procedure.  Do not drink clear liquids within 2 hours of your scheduled arrival to the hospital.  Clear liquids include  --Water or Apple juice without pulp  --Clear carbohydrate beverage such as ClearFast or Gatorade  --Black Coffee or Clear Tea (No milk, no creamers, do not add anything to the coffee or Tea     __x__ 2. No Alcohol for 24 hours before or after surgery.   __x__3. No Smoking or e-cigarettes for 24 prior to surgery.  Do not use any chewable tobacco products for at least 6 hour prior to surgery   ____  4. Bring all medications with you on the day of surgery if instructed.    __x__ 5. Notify your doctor if there is any change in your medical condition     (cold, fever, infections).    x___6. On the morning of surgery brush your teeth with toothpaste and water.  You may rinse your mouth with mouth wash if you wish.  Do not swallow any toothpaste or mouthwash.   Do not wear jewelry, make-up, hairpins, clips or nail polish.  Do not wear lotions, powders, or perfumes. You may wear deodorant.  Do not shave 48 hours prior to surgery. Men may shave face and neck.  Do not bring valuables to the hospital.    [facility] is not responsible for any belongings or valuables.     Contacts, dentures or bridgework may not be worn into surgery.  Leave your suitcase in the car. After surgery it may be brought to your room.  For patients admitted to the hospital, discharge time is determined by your treatment team.  _  Patients discharged the day of surgery will not be allowed to drive home.  You will need someone to drive you home and stay with you the night of your procedure.    Please read over the following fact sheets that you were given:   [facility] Preparing for Surgery and or MRSA Information   ____ Take anti-hypertensive listed below, cardiac, seizure, asthma, anti-reflux and psychiatric medicines. These include:  1. NONE  2.  3.  4.  5.  6.  ____Fleets enema or Magnesium Citrate as directed.   ____ Use CHG Soap or sage wipes as directed on instruction sheet   ____ Use inhalers on the day of surgery and bring to hospital day of surgery  ____ Stop Metformin and Janumet 2 days prior to surgery.    ____ Take 1/2 of usual insulin dose the night before surgery and none on the morning  surgery.   ____ Follow recommendations from Cardiologist, Pulmonologist or PCP regarding stopping Aspirin, Coumadin, Plavix ,Eliquis, Effient, or Pradaxa, and Pletal.  X____Stop Anti-inflammatories such as Advil, Aleve, Ibuprofen, Motrin, Naproxen, Naprosyn, Goodies powders  or aspirin products NOW-OK to take Tylenol   ____ Stop supplements until after surgery.     ____ Bring C-Pap to the hospital.

## 2017-06-02 ENCOUNTER — Ambulatory Visit: Payer: Self-pay | Admitting: Anesthesiology

## 2017-06-02 ENCOUNTER — Encounter: Payer: Self-pay | Admitting: Anesthesiology

## 2017-06-02 ENCOUNTER — Encounter
Admission: RE | Disposition: A | Discharge status: Home / Self Care | Payer: Self-pay | Source: Ambulatory Visit | Attending: Urology

## 2017-06-02 ENCOUNTER — Ambulatory Visit
Admission: RE | Admit: 2017-06-02 | Discharge: 2017-06-02 | Disposition: A | Discharge status: Home / Self Care | Payer: Self-pay | Source: Ambulatory Visit | Attending: Urology | Admitting: Urology

## 2017-06-02 ENCOUNTER — Other Ambulatory Visit: Payer: Self-pay

## 2017-06-02 DIAGNOSIS — N132 Hydronephrosis with renal and ureteral calculous obstruction: Secondary | ICD-10-CM

## 2017-06-02 DIAGNOSIS — Z87442 Personal history of urinary calculi: Secondary | ICD-10-CM | POA: Insufficient documentation

## 2017-06-02 DIAGNOSIS — Q6211 Congenital occlusion of ureteropelvic junction: Secondary | ICD-10-CM

## 2017-06-02 DIAGNOSIS — Z79899 Other long term (current) drug therapy: Secondary | ICD-10-CM | POA: Insufficient documentation

## 2017-06-02 DIAGNOSIS — N200 Calculus of kidney: Secondary | ICD-10-CM

## 2017-06-02 DIAGNOSIS — F17210 Nicotine dependence, cigarettes, uncomplicated: Secondary | ICD-10-CM | POA: Insufficient documentation

## 2017-06-02 HISTORY — PX: CYSTOSCOPY WITH URETEROSCOPY AND STENT PLACEMENT: SHX6377

## 2017-06-02 SURGERY — CYSTOURETEROSCOPY, WITH STENT INSERTION
Anesthesia: General | Laterality: Left

## 2017-06-02 MED ORDER — LIDOCAINE HCL (PF) 2 % IJ SOLN
INTRAMUSCULAR | Status: AC
  Filled 2017-06-02: qty 10

## 2017-06-02 MED ORDER — PROPOFOL 10 MG/ML IV BOLUS
INTRAVENOUS | Status: DC | PRN
  Administered 2017-06-02: 150 mg via INTRAVENOUS

## 2017-06-02 MED ORDER — LACTATED RINGERS IV SOLN
INTRAVENOUS | Status: DC
  Administered 2017-06-02: 08:00:00 via INTRAVENOUS

## 2017-06-02 MED ORDER — ONDANSETRON HCL 4 MG/2ML IJ SOLN
INTRAMUSCULAR | Status: AC
  Filled 2017-06-02: qty 2

## 2017-06-02 MED ORDER — ROCURONIUM BROMIDE 100 MG/10ML IV SOLN
INTRAVENOUS | Status: DC | PRN
  Administered 2017-06-02: 40 mg via INTRAVENOUS

## 2017-06-02 MED ORDER — FENTANYL CITRATE (PF) 100 MCG/2ML IJ SOLN
INTRAMUSCULAR | Status: DC | PRN
  Administered 2017-06-02 (×2): 50 ug via INTRAVENOUS

## 2017-06-02 MED ORDER — FENTANYL CITRATE (PF) 100 MCG/2ML IJ SOLN
INTRAMUSCULAR | Status: AC
  Filled 2017-06-02: qty 2

## 2017-06-02 MED ORDER — FAMOTIDINE 20 MG PO TABS
20.0000 mg | ORAL_TABLET | Freq: Once | ORAL | Status: AC
  Administered 2017-06-02: 20 mg via ORAL

## 2017-06-02 MED ORDER — DEXAMETHASONE SODIUM PHOSPHATE 10 MG/ML IJ SOLN
INTRAMUSCULAR | Status: AC
  Filled 2017-06-02: qty 1

## 2017-06-02 MED ORDER — LIDOCAINE HCL (CARDIAC) PF 100 MG/5ML IV SOSY
PREFILLED_SYRINGE | INTRAVENOUS | Status: DC | PRN
  Administered 2017-06-02: 100 mg via INTRAVENOUS

## 2017-06-02 MED ORDER — CEFAZOLIN SODIUM-DEXTROSE 2-4 GM/100ML-% IV SOLN
2.0000 g | INTRAVENOUS | Status: AC
  Administered 2017-06-02: 2 g via INTRAVENOUS

## 2017-06-02 MED ORDER — CEFAZOLIN SODIUM-DEXTROSE 2-4 GM/100ML-% IV SOLN
INTRAVENOUS | Status: AC
  Filled 2017-06-02: qty 100

## 2017-06-02 MED ORDER — DEXAMETHASONE SODIUM PHOSPHATE 10 MG/ML IJ SOLN
INTRAMUSCULAR | Status: DC | PRN
  Administered 2017-06-02: 10 mg via INTRAVENOUS

## 2017-06-02 MED ORDER — PROPOFOL 10 MG/ML IV BOLUS
INTRAVENOUS | Status: AC
  Filled 2017-06-02: qty 20

## 2017-06-02 MED ORDER — IOTHALAMATE MEGLUMINE 43 % IV SOLN
INTRAVENOUS | Status: DC | PRN
  Administered 2017-06-02: 75 mL via SURGICAL_CAVITY

## 2017-06-02 MED ORDER — MIDAZOLAM HCL 2 MG/2ML IJ SOLN
INTRAMUSCULAR | Status: DC | PRN
  Administered 2017-06-02: 2 mg via INTRAVENOUS

## 2017-06-02 MED ORDER — ONDANSETRON HCL 4 MG/2ML IJ SOLN
INTRAMUSCULAR | Status: DC | PRN
  Administered 2017-06-02: 4 mg via INTRAVENOUS

## 2017-06-02 MED ORDER — FAMOTIDINE 20 MG PO TABS
ORAL_TABLET | ORAL | Status: AC
  Administered 2017-06-02: 20 mg via ORAL
  Filled 2017-06-02: qty 1

## 2017-06-02 MED ORDER — MIDAZOLAM HCL 2 MG/2ML IJ SOLN
INTRAMUSCULAR | Status: AC
  Filled 2017-06-02: qty 2

## 2017-06-02 MED ORDER — ONDANSETRON HCL 4 MG/2ML IJ SOLN: 4.0000 mg | Freq: Once | INTRAMUSCULAR | Status: DC | PRN

## 2017-06-02 MED ORDER — NEOSTIGMINE METHYLSULFATE 10 MG/10ML IV SOLN
INTRAVENOUS | Status: DC | PRN
  Administered 2017-06-02: 3 mg via INTRAVENOUS

## 2017-06-02 MED ORDER — EPHEDRINE SULFATE 50 MG/ML IJ SOLN
INTRAMUSCULAR | Status: AC
  Filled 2017-06-02: qty 1

## 2017-06-02 MED ORDER — FENTANYL CITRATE (PF) 100 MCG/2ML IJ SOLN: 25.0000 ug | INTRAMUSCULAR | Status: DC | PRN

## 2017-06-02 MED ORDER — GLYCOPYRROLATE 0.2 MG/ML IJ SOLN
INTRAMUSCULAR | Status: DC | PRN
  Administered 2017-06-02: 0.6 mg via INTRAVENOUS

## 2017-06-02 SURGICAL SUPPLY — 30 items
BAG DRAIN CYSTO-URO LG1000N (MISCELLANEOUS) ×3 IMPLANT
BASKET ZERO TIP 1.9FR (BASKET) IMPLANT
BRUSH SCRUB EZ 1% IODOPHOR (MISCELLANEOUS) ×3 IMPLANT
CATH URETL 5X70 OPEN END (CATHETERS) ×3 IMPLANT
CNTNR SPEC 2.5X3XGRAD LEK (MISCELLANEOUS)
CONRAY 43 FOR UROLOGY 50M (MISCELLANEOUS) ×3 IMPLANT
CONT SPEC 4OZ STER OR WHT (MISCELLANEOUS)
CONTAINER SPEC 2.5X3XGRAD LEK (MISCELLANEOUS) IMPLANT
DRAPE UTILITY 15X26 TOWEL STRL (DRAPES) ×3 IMPLANT
FIBER LASER LITHO 273 (Laser) IMPLANT
GLOVE BIO SURGEON STRL SZ8 (GLOVE) ×3 IMPLANT
GOWN STRL REUS W/ TWL LRG LVL3 (GOWN DISPOSABLE) ×2 IMPLANT
GOWN STRL REUS W/TWL LRG LVL3 (GOWN DISPOSABLE) ×4
GUIDEWIRE GREEN .038 145CM (MISCELLANEOUS) IMPLANT
INFUSOR MANOMETER BAG 3000ML (MISCELLANEOUS) ×3 IMPLANT
INTRODUCER DILATOR DOUBLE (INTRODUCER) IMPLANT
KIT TURNOVER CYSTO (KITS) ×3 IMPLANT
PACK CYSTO AR (MISCELLANEOUS) ×3 IMPLANT
SENSORWIRE 0.038 NOT ANGLED (WIRE) ×6
SET CYSTO W/LG BORE CLAMP LF (SET/KITS/TRAYS/PACK) ×3 IMPLANT
SHEATH URETERAL 12FR 45CM (SHEATH) ×3 IMPLANT
SHEATH URETERAL 12FRX35CM (MISCELLANEOUS) ×3 IMPLANT
SOL .9 NS 3000ML IRR  AL (IV SOLUTION) ×2
SOL .9 NS 3000ML IRR UROMATIC (IV SOLUTION) ×1 IMPLANT
STENT CONTOUR 7FRX24 (STENTS) ×3 IMPLANT
STENT URET 6FRX24 CONTOUR (STENTS) IMPLANT
STENT URET 6FRX26 CONTOUR (STENTS) IMPLANT
SURGILUBE 2OZ TUBE FLIPTOP (MISCELLANEOUS) ×3 IMPLANT
WATER STERILE IRR 1000ML POUR (IV SOLUTION) ×3 IMPLANT
WIRE SENSOR 0.038 NOT ANGLED (WIRE) ×2 IMPLANT

## 2017-06-02 NOTE — Anesthesia Procedure Notes (Signed)
Procedure Name: Intubation Performed by: Babs Sciara, CRNA Pre-anesthesia Checklist: Patient identified, Emergency Drugs available, Suction available, Patient being monitored and Timeout performed Patient Re-evaluated:Patient Re-evaluated prior to induction Oxygen Delivery Method: Circle system utilized Preoxygenation: Pre-oxygenation with 100% oxygen Induction Type: IV induction Ventilation: Mask ventilation without difficulty Laryngoscope Size: Mac and 3 Grade View: Grade I Tube type: Oral Tube size: 7.5 mm Number of attempts: 1 Airway Equipment and Method: Stylet Placement Confirmation: ETT inserted through vocal cords under direct vision,  positive ETCO2 and breath sounds checked- equal and bilateral Secured at: 22 cm Tube secured with: Tape Dental Injury: Teeth and Oropharynx as per pre-operative assessment

## 2017-06-02 NOTE — Anesthesia Preprocedure Evaluation (Addendum)
Anesthesia Evaluation  Patient identified by MRN, date of birth, ID band Patient awake    Reviewed: Allergy & Precautions, NPO status , Patient's Chart, lab work & pertinent test results, reviewed documented beta blocker date and time   Airway Mallampati: II  TM Distance: >3 FB     Dental  (+) Chipped, Missing   Pulmonary Current Smoker,           Cardiovascular      Neuro/Psych    GI/Hepatic   Endo/Other    Renal/GU      Musculoskeletal  (+) Arthritis ,   Abdominal   Peds  Hematology   Anesthesia Other Findings   Reproductive/Obstetrics                            Anesthesia Physical Anesthesia Plan  ASA: II  Anesthesia Plan: General   Post-op Pain Management:    Induction: Intravenous  PONV Risk Score and Plan:   Airway Management Planned: LMA  Additional Equipment:   Intra-op Plan:   Post-operative Plan:   Informed Consent: I have reviewed the patients History and Physical, chart, labs and discussed the procedure including the risks, benefits and alternatives for the proposed anesthesia with the patient or authorized representative who has indicated his/her understanding and acceptance.     Plan Discussed with: CRNA  Anesthesia Plan Comments:         Anesthesia Quick Evaluation

## 2017-06-02 NOTE — Interval H&P Note (Signed)
History and Physical Interval Note:  06/02/2017 8:54 AM  Gregory Graves  has presented today for surgery, with the diagnosis of left UPJ obstruction, left nephrolithiasis  The various methods of treatment have been discussed with the patient and family. After consideration of risks, benefits and other options for treatment, the patient has consented to  Procedure(s): CYSTOSCOPY WITH URETEROSCOPY AND STENT Exchange (Left) as a surgical intervention .  The patient's history has been reviewed, patient examined, no change in status, stable for surgery.  I have reviewed the patient's chart and labs.  Questions were answered to the patient's satisfaction.     [name]

## 2017-06-02 NOTE — Anesthesia Postprocedure Evaluation (Signed)
Anesthesia Post Note  Patient: Gregory Graves  Procedure(s) Performed: CYSTOSCOPY WITH URETEROSCOPY AND STENT Exchange (Left )  Anesthesia Type: General     Last Vitals:  Vitals:   06/02/17 0810 06/02/17 1027  BP: 123/72 (!) 143/86  Pulse: 62 93  Resp: 18 (!) 23  Temp: 36.5 C (!) 36.3 C  SpO2: 100% 100%    Last Pain:  Vitals:   06/02/17 1027  TempSrc: Tympanic  PainSc:                  Milagros Reap

## 2017-06-02 NOTE — Discharge Instructions (Signed)

## 2017-06-02 NOTE — Op Note (Signed)
Preoperative diagnosis:  1. Left UPJ obstruction 2. Left nephrolithiasis  Postoperative diagnosis:  1. Left hydronephrosis 2. Left nephrolithiasis  Procedure: 1. Cystoscopy 2. Left ureteroscopy-diagnostic 3. Left ureteral stent exchange 4. Left retrograde pyelogram with interpretation  Surgeon: Riki AltesScott C Stoioff, MD  Anesthesia: General  Complications: None  Intraoperative findings:  1.  UPJ without stricture/kinking 2.  Left renal calcification not within the collecting system 3.  Left retrograde pyelogram with moderate to severe hydronephrosis.  Left renal calcification inferior to lower pole calyces without evidence of a calyceal diverticulum.  Severe left hydronephrosis  EBL: Minimal  Specimens: None  Indication: Gregory Graves is a 57 y.o. patient with severe left hydronephrosis and a left lower pole nonobstructing renal calculus.  He previously underwent retrograde pyelogram with some narrowing in the region of the UPJ and has had an indwelling stent for the past several weeks.  A renal scan showed excellent function of the left kidney at 68%.  After reviewing the management options for treatment, he elected to proceed with the above surgical procedure(s). We have discussed the potential benefits and risks of the procedure, side effects of the proposed treatment, the likelihood of the patient achieving the goals of the procedure, and any potential problems that might occur during the procedure or recuperation. Informed consent has been obtained.  Description of procedure:  The patient was taken to the operating room and general anesthesia was induced.  The patient was placed in the dorsal lithotomy position, prepped and draped in the usual sterile fashion, and preoperative antibiotics were administered. A preoperative time-out was performed.   A 22 [place]French scope was lubricated and passed under direct vision.  The urethra was normal caliber without stricture.  There was mild  lateral lobe enlargement and moderate bladder neck elevation.  The bladder mucosa was closely inspected and was without erythema, solid or papillary lesions.  There were mild inflammatory changes at the left ureteral orifice secondary to the indwelling stent.  A 0.038 Sensor wire was placed through the cystoscope and into the left ureteral orifice and easily passed through the left renal pelvis under fluoroscopic guidance.  The cystoscope was removed and repassed.  The stent was grasped with endoscopic forceps and brought out to the urethral meatus and a second guidewire was placed through the stent without difficulty.  A 12/14 French ureteral access sheath was then placed over the working wire without difficulty.  A digital flexible ureteroscope was placed through the sheath and advanced proximally.  The ureteroscope easily traversed the proximal ureter and UPJ and there was no evidence of scarring, obstruction or ureteral tortuosity/kinking.  Retrograde pyelogram was performed which with findings as described above.  The left renal calcification was inferior to the lower pole calyx.  The lower pole calyx just superior to the renal calcification was examined and no stone was identified.  Repeat retrograde pyelogram was performed and there did not appear to be communication with the collecting system.  The ureteroscope and access sheath were then withdrawn in tandem and the ureter was normal in appearance.  A 7 French/24 cm double-J ureteral stent was placed over the safety under fluoroscopic guidance without difficulty.  The stent was left attached to a tether wire and the patient will remove and 72 hours.  After anesthetic reversal the patient was transported to the PACU in stable condition.  Riki AltesScott C Stoioff, M.D.

## 2017-06-02 NOTE — Anesthesia Postprocedure Evaluation (Signed)
Anesthesia Post Note  Patient: Jobanny Mavis  Procedure(s) Performed: CYSTOSCOPY WITH URETEROSCOPY AND STENT Exchange (Left )  Patient location during evaluation: PACU Anesthesia Type: General Level of consciousness: awake and alert Pain management: pain level controlled Vital Signs Assessment: post-procedure vital signs reviewed and stable Respiratory status: spontaneous breathing, nonlabored ventilation, respiratory function stable and patient connected to nasal cannula oxygen Cardiovascular status: blood pressure returned to baseline and stable Postop Assessment: no apparent nausea or vomiting Anesthetic complications: no     Last Vitals:  Vitals:   06/02/17 1113 06/02/17 1123  BP: (!) 147/76   Pulse: 61 80  Resp: 16 (!) 22  Temp: 36.4 C   SpO2: 100% 100%    Last Pain:  Vitals:   06/02/17 1123  TempSrc:   PainSc: 0-No pain                 Berthold Glace S

## 2017-06-02 NOTE — Anesthesia Post-op Follow-up Note (Signed)
Anesthesia QCDR form completed.        

## 2017-06-02 NOTE — Transfer of Care (Signed)
Immediate Anesthesia Transfer of Care Note  Patient: Gregory Graves  Procedure(s) Performed: CYSTOSCOPY WITH URETEROSCOPY AND STENT Exchange (Left )  Patient Location: PACU  Anesthesia Type:General  Level of Consciousness: awake, alert  and oriented  Airway & Oxygen Therapy: Patient Spontanous Breathing and Patient connected to face mask oxygen  Post-op Assessment: Report given to RN and Post -op Vital signs reviewed and stable  Post vital signs: Reviewed and stable  Last Vitals:  Vitals Value Taken Time  BP 143/86 06/02/2017 10:27 AM  Temp 36.3 C 06/02/2017 10:27 AM  Pulse 86 06/02/2017 10:32 AM  Resp 16 06/02/2017 10:32 AM  SpO2 100 % 06/02/2017 10:32 AM  Vitals shown include unvalidated device data.  Last Pain:  Vitals:   06/02/17 1027  TempSrc: Tympanic  PainSc:          Complications: No apparent anesthesia complications

## 2017-06-21 ENCOUNTER — Telehealth: Payer: Self-pay | Admitting: Urology

## 2017-06-21 NOTE — Telephone Encounter (Signed)
Pt LMOM stating that he removed his stent on the 3rd and is now in severe pain on his Left side, he states he is aware of his appt next week, pt did not leave a call back number, but did ask for a return call for advice about the severe pain. Thanks.

## 2017-06-30 ENCOUNTER — Encounter: Payer: Self-pay | Admitting: Urology

## 2017-06-30 ENCOUNTER — Ambulatory Visit (INDEPENDENT_AMBULATORY_CARE_PROVIDER_SITE_OTHER): Payer: Self-pay | Admitting: Urology

## 2017-06-30 VITALS — BP 114/63 | HR 66 | Resp 16 | Ht 71.0 in | Wt 183.2 lb

## 2017-06-30 DIAGNOSIS — N1339 Other hydronephrosis: Secondary | ICD-10-CM

## 2017-06-30 NOTE — Progress Notes (Signed)
06/30/2017 2:43 PM   Gregory Graves [birth year] [id number]  Referring provider: No referring provider defined for this encounter.  Chief Complaint  Patient presents with  . Routine Post Op    HPI: 57 year old male presents for postop follow-up.  He was initially seen for left flank pain with hydronephrosis and a left lower pole calcification.  He initially underwent stent placement.  A renal ultrasound showed better function in the left kidney with 68%.  Ureteroscopy was performed on 06/02/2017 which showed no evidence of stricture, tortuosity or kinking of the ureter.  Retrograde pyelogram was unremarkable.  The stent was replaced postoperatively however he removed it 72 hours postop and had no problems post stent removal.  The left renal calcification appeared to be intraparenchymal and there was no communication with the collecting system.  He did have an episode of left flank pain last week which was intermittent for 2 days.  It was moderate to severe at times.  He had no nausea, vomiting, fever or chills.  His pain subsequently resolved and has not recurred.   PMH: Past Medical History:  Diagnosis Date  . Arthritis   . History of kidney stones     Surgical History: Past Surgical History:  Procedure Laterality Date  . CYSTOSCOPY W/ RETROGRADES Left 04/18/2017   Procedure: CYSTOSCOPY WITH RETROGRADE PYELOGRAM;  Surgeon: Riki AltesStoioff, Evaleen Sant C, MD;  Location: [facility];  Service: Urology;  Laterality: Left;  . CYSTOSCOPY WITH STENT PLACEMENT Left 04/18/2017   Procedure: CYSTOSCOPY WITH STENT PLACEMENT;  Surgeon: Riki AltesStoioff, Jenika Chiem C, MD;  Location: [facility];  Service: Urology;  Laterality: Left;  . CYSTOSCOPY WITH URETEROSCOPY Left 04/18/2017   Procedure: CYSTOSCOPY WITH URETEROSCOPY;  Surgeon: Riki AltesStoioff, Keyarra Rendall C, MD;  Location: [facility];  Service: Urology;  Laterality: Left;  . CYSTOSCOPY WITH URETEROSCOPY AND STENT PLACEMENT Left 06/02/2017   Procedure: CYSTOSCOPY WITH URETEROSCOPY AND STENT  Exchange;  Surgeon: Riki AltesStoioff, Bristal Steffy C, MD;  Location: [facility];  Service: Urology;  Laterality: Left;  . LITHOTRIPSY      Home Medications:  Allergies as of 06/30/2017   No Known Allergies     Medication List        Accurate as of 06/30/17  2:43 PM. Always use your most recent med list.          acetaminophen 500 MG tablet Commonly known as:  TYLENOL Take 1,000 mg by mouth daily as needed for moderate pain or headache.   fluticasone 50 MCG/ACT nasal spray Commonly known as:  FLONASE Place 1 spray into both nostrils daily as needed for allergies or rhinitis.   multivitamin tablet Take 1 tablet by mouth daily.       Allergies: No Known Allergies  Family History: Family History  Problem Relation Age of Onset  . Prostate cancer Neg Hx   . Bladder Cancer Neg Hx   . Kidney cancer Neg Hx     Social History:  reports that he has been smoking cigarettes.  He has a 9.00 pack-year smoking history. He has never used smokeless tobacco. He reports that he does not drink alcohol or use drugs.  ROS: UROLOGY Frequent Urination?: No Hard to postpone urination?: No Burning/pain with urination?: No Get up at night to urinate?: No Leakage of urine?: No Urine stream starts and stops?: No Trouble starting stream?: No Do you have to strain to urinate?: No Blood in urine?: No Urinary tract infection?: No Sexually transmitted disease?: No Injury to kidneys or bladder?: No Painful intercourse?: No Weak  stream?: No Erection problems?: No Penile pain?: No Currently pregnant?: No Vaginal bleeding?: No Last menstrual period?: n  Gastrointestinal Nausea?: No Vomiting?: No Indigestion/heartburn?: No Diarrhea?: No Constipation?: No  Constitutional Fever: No Night sweats?: No Weight loss?: No Fatigue?: No  Skin Skin rash/lesions?: No Itching?: No  Eyes Blurred vision?: No Double vision?: No  Ears/Nose/Throat Sore throat?: No Sinus problems?:  No  Hematologic/Lymphatic Swollen glands?: No Easy bruising?: No  Cardiovascular Leg swelling?: No Chest pain?: No  Respiratory Cough?: No Shortness of breath?: No  Endocrine Excessive thirst?: No  Musculoskeletal Back pain?: No Joint pain?: No  Neurological Headaches?: No Dizziness?: No  Psychologic Depression?: No Anxiety?: No  Physical Exam: BP 114/63   Pulse 66   Resp 16   Ht 5\' 11"  (1.803 m)   Wt 183 lb 3.2 oz (83.1 kg)   SpO2 96%   BMI 25.55 kg/m    Constitutional:  Alert and oriented, No acute distress. HEENT:  AT, moist mucus membranes.  Trachea midline, no masses. Cardiovascular: No clubbing, cyanosis, or edema. Respiratory: Normal respiratory effort, no increased work of breathing. GI: Abdomen is soft, nontender, nondistended, no abdominal masses GU: No CVA tenderness Lymph: No cervical or inguinal lymphadenopathy. Skin: No rashes, bruises or suspicious lesions. Neurologic: Grossly intact, no focal deficits, moving all 4 extremities. Psychiatric: Normal mood and affect.   Assessment & Plan:   Left hydronephrosis without evidence of UPJ obstruction.  His Lasix renogram was performed while stented.  Have recommended a follow-up Lasix renogram in 3 months.  If he has recurrent episodes of flank pain will schedule the study earlier.   Riki Altes, MD  [facility] [address] [place], [place] [address] [phone]

## 2017-07-05 ENCOUNTER — Encounter: Payer: Self-pay | Admitting: Urology

## 2017-10-09 ENCOUNTER — Ambulatory Visit: Payer: Self-pay | Admitting: Urology

## 2017-10-20 ENCOUNTER — Encounter: Payer: Self-pay | Admitting: Urology

## 2017-10-22 NOTE — Progress Notes (Signed)
No show

## 2017-10-23 ENCOUNTER — Encounter: Payer: Self-pay | Admitting: Urology
